# Patient Record
Sex: Female | Born: 1995 | Hispanic: Yes | Smoking: Former smoker
Health system: Southern US, Community
[De-identification: ages and names within clinical notes are randomized; demographics above are authoritative.]

## PROBLEM LIST (undated history)

## (undated) DIAGNOSIS — Z789 Other specified health status: Secondary | ICD-10-CM

## (undated) HISTORY — DX: Other specified health status: Z78.9

## (undated) HISTORY — PX: NO PAST SURGERIES: SHX2092

---

## 2019-01-16 NOTE — L&D Delivery Note (Addendum)
OB/GYN Faculty Practice Delivery Note  Alison Martin is a 24 y.o. G1P0 s/p induced vaginal at [redacted]w[redacted]d. She was admitted for NR NST.   GBS Status: Negative/-- (09/30 1428) Maximum Maternal Temperature: Temp (24hrs), Avg:99 F (37.2 C), Min:98.1 F (36.7 C), Max:101.3 F (38.5 C)  Labor Progress: Admitted for induction of labor Status post foley balloon & cytotec, then pitocin.  AROM 10h 50m prior to delivery with clear fluid  Pitocin until complete dilation achieved.   Delivery Date/Time: 11/12/2019 at 1747 Delivery: Called to room and patient was complete and pushing. Head delivered LOA.  No nuchal cord present. . Shoulder and body delivered in usual fashion. Infant with spontaneous cry, placed skin to skin on mother's abdomen, dried and stimulated. Cord clamped x 2 after 1-minute delay, and cut by FOB. Cord blood drawn. Placenta delivered spontaneously with gentle cord traction. Fundus firm with massage and Pitocin. Labia, perineum, vagina, and cervix inspected with 2nd degree perineal required repair with 3.0, vicryl. .   Placenta: spontaneous, intact, sent to L&D Complications: maternal fever immediately prior to delivery EBL: 250 mL  Analgesia: Epidural anesthesia  Postpartum Planning Mom and baby to mother/baby.  Lactation consult Contraception IP nexplanon     Language Barrier In person interpretor present in room during delivery  Infant: Viable girl   APGAR: 7/9   see delivery summary for infant birthweight  Genia Hotter, M.D.  11/12/2019 6:18 PM  I was present and gloved for delivery of infant and placenta. I assisted resident with laceration as noted above and agree with resident's note.  Sheila Oats, MD OB Fellow, Faculty Practice 11/12/2019 6:48 PM

## 2019-05-04 ENCOUNTER — Encounter: Payer: Self-pay | Admitting: General Practice

## 2019-05-12 ENCOUNTER — Telehealth: Payer: Self-pay | Admitting: General Practice

## 2019-05-12 ENCOUNTER — Other Ambulatory Visit: Payer: Self-pay

## 2019-05-12 ENCOUNTER — Ambulatory Visit (INDEPENDENT_AMBULATORY_CARE_PROVIDER_SITE_OTHER): Payer: Self-pay | Admitting: *Deleted

## 2019-05-12 VITALS — BP 120/82 | HR 85 | Temp 98.0°F | Ht 68.0 in | Wt 197.4 lb

## 2019-05-12 DIAGNOSIS — Z3400 Encounter for supervision of normal first pregnancy, unspecified trimester: Secondary | ICD-10-CM | POA: Insufficient documentation

## 2019-05-12 MED ORDER — CITRANATAL BLOOM 90-1 MG PO TABS: 1.0000 | ORAL_TABLET | Freq: Every day | ORAL | 0 refills | Status: DC

## 2019-05-12 NOTE — Progress Notes (Signed)
   PRENATAL INTAKE SUMMARY  Ms. Mcneece presents today New OB Nurse Interview.  OB History    Gravida  1   Para      Term      Preterm      AB      Living        SAB      TAB      Ectopic      Multiple      Live Births             I have reviewed the patient's medical, obstetrical, social, and family histories, medications, and available lab results. Spanish [place]pre[place] ID: [id number]  SUBJECTIVE She has no unusual complaints  OBJECTIVE Initial nurse interview for history/labs (New OB).  [facility]   Late to care  EDD: 10/19/19 by LMP GA: [redacted]w[redacted]d G1P0 FHT: 145  GENERAL APPEARANCE: alert, well appearing, in no apparent distress, oriented to person, place and time   ASSESSMENT Normal pregnancy  PLAN Prenatal care-[facility] Pnl/HIV  OB Urine Culture GC/CT at next visit with Raelyn Mora, CNM 06/04/19 HgbEval/SMA/CF ([facility]) Panorama A1C AFP Patient to sign up for Babyscripts BP cuff given/ patient to purchase weight scale Ultrasound ordered-anatomy-[place] HD PNV samples given  Clovis Pu, RN

## 2019-05-12 NOTE — Patient Instructions (Signed)
Segundo trimestre de [employer] Trimester of Pregnancy  El segundo trimestre va desde la semana14 hasta la 27 (desde el mes 4 hasta el 6). Este suele ser el momento en el que mejor se siente. En general, las nuseas matutinas han disminuido o han desaparecido completamente. Tendr ms energa y podr aumentarle el apetito. El beb en gestacin se desarrolla rpidamente. Hacia el final del sexto mes, el beb mide aproximadamente 9 pulgadas (23 cm) y pesa alrededor de 1 libras (700 g). Es probable que sienta al beb [employer] 18 y 20 semanas del [place]. Siga estas indicaciones en su casa: Medicamentos  Bax[employer] International de venta libre y los recetados solamente como se lo haya indicado el mdico. Algunos medicamentos son seguros para tomar durante el Psychiatrist y otros no lo son.  Tome vitaminas prenatales que contengan por lo menos (?g) de cido flico.  Si tiene dificultad para mover el intestino (estreimiento), tome un medicamento para ablandar las heces (laxante) si su mdico se lo autoriza. Comida y bebida   Ingiera alimentos saludables de Roundup regular.  No coma carne cruda ni quesos sin cocinar.  Si obtiene poca cantidad de calcio de los alimentos que ingiere, consulte a su mdico sobre la posibilidad de tomar un suplemento diario de calcio.  Evite el consumo de alimentos ricos en grasas y azcares, como los alimentos fritos y los dulces.  Si tiene Programme researcher, broadcasting/film/video (nuseas) o devuelve (vomita): ? Ingiera 4 o 5comidas pequeas por Geophysical data processor de 3abundantes. ? Intente comer algunas galletitas saladas. ? Beba lquidos Altria Group, en lugar de [employer].  Para evitar el estreimiento: ? Consuma alimentos ricos en fibra, como frutas y verduras frescas, cereales integrales y frijoles. ? Beba suficiente lquido para mantener el pis (orina) claro o de color amarillo plido. Actividad  Haga ejercicios solamente como se lo  haya indicado el mdico. Interrumpa la actividad fsica si comienza a tener calambres.  No haga ejercicio si hace demasiado calor, hay demasiada humedad o se encuentra en un lugar de mucha altura (altitud alta).  Evite levantar pesos [employer].  Use zapatos con tacones bajos. Mantenga una buena postura al sentarse y pararse.  Puede continuar teniendo [employer], a menos que el mdico le indique lo contrario. Alivio del dolor y del Dentist  Use un sostn que le brinde buen soporte si sus mamas estn sensibles.  Dese baos de asiento con agua tibia para Engineer, materials o las molestias causadas por las hemorroides. Use una crema para las hemorroides si el mdico la autoriza.  Descanse con las piernas elevadas si tiene calambres o dolor de cintura.  Si desarrolla venas hinchadas y abultadas (vrices) en las piernas: ? Use medias de compresin o medias de descanso como se lo haya indicado el mdico. ? Levante (eleve) los pies durante , 3 o 4veces por Futures trader. ? Limite el consumo de sal en sus alimentos. Cuidado prenatal  Tonga sus preguntas. Llvelas cuando concurra a las visitas prenatales.  Concurra a todas las visitas prenatales como se lo haya indicado el mdico. Esto es importante. Seguridad  [employer] cinturn de seguridad cuando conduzca.  Haga una lista de los nmeros de telfono de Associate Professor, que [employer] nmeros de telfono de familiares, amigos, [place] [facility], as como los departamentos de polica y bomberos. Instrucciones generales  Consulte a su mdico sobre los [employer]oco[employer]illips debe comer o pdale que la ayude a Clinical research associate a quien pueda aconsejarla si necesita ese  servicio.  Consulte a su mdico acerca de dnde se dictan clases prenatales cerca de donde vive. Comience las clases antes del mes 6 de embarazo.  No se d baos de inmersin en agua caliente, baos turcos ni saunas.  No se haga duchas vaginales ni use tampones o toallas higinicas  perfumadas.  No mantenga las piernas cruzadas durante [place].  Vaya al dentista si an no lo hizo. Use un cepillo de cerdas suaves para cepillarse los dientes. Psese el hilo dental suavemente.  No fume, no consuma hierbas ni beba alcohol. No tome frmacos que el mdico no haya autorizado.  No consuma ningn producto que contenga nicotina o tabaco, como cigarrillos y Administrator, Civil Service. Si necesita ayuda para dejar de fumar, consulte al mdico.  Evite el contacto con las bandejas sanitarias de los gatos y la tierra que estos animales usan. Estos elementos contienen bacterias que pueden causar defectos congnitos al beb y la posible prdida del beb (aborto espontneo) o la muerte fetal. Comunquese con un mdico si:  Tiene clicos leves o siente presin en la parte baja del vientre.  Tiene dolor al hacer pis (orinar).  Advierte un lquido con olor ftido que proviene de la vagina.  Tiene Programme researcher, broadcasting/film/video (nuseas), devuelve (vomita) o tiene deposiciones acuosas (diarrea).  Sufre un dolor persistente en el abdomen.  Siente mareos. Solicite ayuda de inmediato si:  Tiene fiebre.  Tiene una prdida de lquido por la vagina.  Tiene sangrado o pequeas prdidas vaginales.  Siente dolor intenso o clicos en el abdomen.  Sube o baja de peso rpidamente.  Tiene dificultades para recuperar el aliento y siente dolor en el pecho.  Sbitamente se le hinchan mucho el rostro, las Banks, los tobillos, los pies o las piernas.  No ha sentido los movimientos del beb durante Georgianne Fick.  Siente un dolor de cabeza intenso que no se alivia al tomar [employer].  Tiene dificultad para ver. Resumen  El segundo trimestre va desde la semana14 hasta la 27, desde el mes 4 hasta el 6. Este suele ser el momento en el que mejor se siente.  Para cuidarse y cuidar a su beb en gestacin, debe comer alimentos saludables, tomar medicamentos solamente si su mdico le indica que lo haga y  hacer actividades que sean seguras para usted y su beb.  Llame al mdico si se enferma o si nota algo inusual acerca de su embarazo. Tambin llame al mdico si necesita ayuda para saber qu alimentos debe comer o si quiere saber qu actividades puede realizar de forma segura. Esta informacin no tiene Theme park manager el consejo del mdico. Asegrese de hacerle al mdico cualquier pregunta que tenga. Document Revised: 09/26/2016 Document Reviewed: 09/26/2016 Elsevier Patient Education  2020 [employer].  Signos de advertencia durante el embarazo Warning Signs During Pregnancy Generalmente, el embarazo dura unas 40 semanas, a partir del Social worker del ltimo perodo hasta que el beb nace. Se divide en tres fases llamadas trimestres.  El Engineer, maintenance trimestre se refiere a Lawyer 1 [employer] la semana 13 de [place].  El segundo trimestre es el comienzo de la semana 14 hasta el final de la semana 27.  El tercer trimestre es el comienzo de la semana 28 hasta el nacimiento del beb. Durante cada trimestre de embarazo, ciertos signos y sntomas pueden indicar un problema. Hable con su mdico acerca de su actual estado de salud y cualquier afeccin mdica que tenga. Asegrese de [employer]user[employer]ch a los que debera estar atenta  e informar. De qu modo la afecta a usted?  Signos de Airline pilotadvertencia en el primer trimestre de embarazo Si bien algunos cambios durante el primer trimestre pueden ser incmodos, la mayora no representa un problema grave. Informe al mdico si tiene alguno de los siguientes signos de advertencia durante Financial risk analystel primer trimestre:  No puede comer ni beber sin vomitar, y esto se prolonga durante ms de Civil engineer, contractingun da.  Tiene sangrado o manchado vaginal junto con clicos parecidos a los de Tax adviserla menstruacin.  Tiene diarrea durante ms de Civil engineer, contractingun da.  Tiene fiebre u otros signos de infeccin, como: ? Engineer, miningDolor o ardor al Geographical information systems officerorinar. ? Olor ftido o secrecin vaginal espesa o amarillenta. Signos de  advertencia en el segundo trimestre de embarazo A medida que el beb crece y cambia durante el segundo trimestre, hay otros signos y sntomas que pueden indicar un problema. Estos incluyen los siguientes:  Signos y sntomas de infeccin, incluyendo Pine Island Centerfiebre.  Signos o sntomas de un aborto espontneo o un parto prematuro, por ejemplo, contracciones regulares, clicos parecidos a los de la menstruacin o dolor en la parte inferior del abdomen.  Secrecin vaginal acuosa o con sangre o sangrado vaginal obvio.  Sentir que el corazn late Dearingfuerte.  Dificultad para respirar.  Nuseas, vmitos o diarrea que dura ms de Civil engineer, contractingun da.  Deseo compulsivo de comer cosas que no son alimentos, tales como arcilla, tiza o [place]. Esto puede ser un signo de una afeccin mdica muy tratable llamada pica. Ms adelante, en el segundo trimestre, observe si tiene signos y sntomas de una enfermedad grave denominada preeclampsia.Estos incluyen los siguientes:  Cambios en la visin.  Un dolor de cabeza intenso que no se [place]oalivia.  Nuseas y vmitos. Tambin es importante observar si el beb deja de moverse o se mueve menos que lo normal durante este trimestre. Signos de Radiographer, therapeuticadvertencia en el tercer trimestre de embarazo A medida que se acerca el tercer trimestre del embarazo, el beb crece y su cuerpo se prepara para el nacimiento. En el tercer trimestre, asegrese de informarle a su mdico si:  Tiene signos y sntomas de infeccin, incluyendo fiebre.  Tiene una hemorragia vaginal abundante.  Nota que su beb se mueve menos que lo habitual o no se mueve.  Tiene nuseas, vmitos o diarrea que dura ms de Civil engineer, contractingun da.  Siente un dolor de cabeza intenso que no se Sunolalivia.  Tiene cambios en la visin, como ver manchas o tener visin borrosa o ver doble.  Aumenta la hinchazn en sus manos o rostro. De qu modo lo afecta al beb? Durante todo el Palmhurstembarazo, siempre informe cualquier signo de advertencia de un problema al  mdico. Esto puede ayudar a prevenir las complicaciones que pueden afectar a su beb, por ejemplo:  Aumento del riesgo de nacimiento prematuro.  Infecciones que pueden transmitirse al beb.  Aumento del riesgo de muerte fetal. Comunquese con un mdico si:  Tiene cualquier signo de advertencia de un problema para el actual trimestre de su embarazo.  Cualquiera de lo siguiente se aplica a usted durante cualquier trimestre del embarazo: ? Tiene emociones fuertes, como tristeza o ansiedad, que interfieren con el trabajo o las relaciones personales. ? Se siente insegura en su casa y necesita ayuda para encontrar un lugar seguro para vivir. ? [place] productos de tabaco, alcohol o drogas y French Southern Territoriesnecesita ayuda para dejar de hacerlo. Solicite ayuda de inmediato si: Tiene signos o sntomas de trabajo de parto antes de las 37 semanas de Mershonembarazo. Estos incluyen los siguientes:  Contracciones separadas unas de otras por intervalos de 5 minutos o menos, o que aumentan en frecuencia, intensidad o duracin.  Dolor abdominal sbito y agudo o Educational psychologist.  Chorro repentino o goteo constante de lquido proveniente de la vagina. Resumen  Generalmente, el embarazo dura unas 40 semanas, a partir del Social worker del ltimo perodo hasta que el beb nace. Se divide en tres fases llamadas trimestres. Cada trimestre tiene signos de advertencia a los que debe [address]  Siempre informe cualquier signo de advertencia a su mdico para evitar las complicaciones que pueden afectar tanto a usted como a su beb.  Hable con su mdico acerca de su actual estado de salud y cualquier afeccin mdica que tenga. Asegrese de [employer]user-[employer]ch a los que debera estar atenta e informar. Esta informacin no tiene Theme park manager el consejo del mdico. Asegrese de hacerle al mdico cualquier pregunta que tenga. Document Revised: 01/16/2017 Document Reviewed: 01/16/2017 Elsevier Patient Education  2020 [employer].  Estudios genticos durante Engineer, manufacturing During Pregnancy Los estudios genticos que se realizan durante el embarazo tambin se denominan pruebas genticas prenatales. Este tipo de pruebas puede determinar si el beb est en riesgo de nacer con un trastorno causado por genes o cromosomas anormales (trastorno gentico). Los cromosomas contienen los genes que controlan cmo se desarrollar el beb en el tero. Hay muchos trastornos genticos diferentes. Algunos ejemplos de trastornos genticos que pueden hallarse a travs de estudios genticos son el sndrome de Down y la fibrosis [place]. Los cambios genticos (mutaciones) pueden transmitirse de padres a hijos. Los [employer] genticos se ofrecen a todas las mujeres antes del embarazo o durante su transcurso. Cada mujer puede elegir si desea Ameren Corporation genticos. Por qu se hacen los estudios genticos? Los estudios genticos se Heritage manager para averiguar si el nio est en riesgo de tener un trastorno gentico. International aid/development worker estudios genticos le permite:  Chiropractor los resultados de los estudios y sus opciones con un asesor gentico.  Prepararse para un beb que podra nacer con un trastorno gentico. Aprender acerca del trastorno con anticipacin le ayuda a estar mejor preparada para manejarlo. Sus mdicos tambin pueden estar preparados en caso de que el beb necesite cuidados especiales antes o despus del nacimiento.  Considerar si desea continuar con el embarazo. En algunos casos, los estudios genticos pueden realizarse para obtener informacin United Stationers rasgos que el nio heredar. Tipos de estudios genticos ONEOK tipos bsicos de estudios genticos. Las pruebas de deteccin sistemticas indican si el beb en gestacin (feto) tiene un riesgo ms alto de tener un trastorno gentico. Las pruebas de diagnstico evalan las clulas fetales reales para diagnosticar un trastorno gentico. Engineer, manufacturing de  deteccin sistemticas     Las pruebas de deteccin sistemticas no le hacen dao al beb. Se recomiendan a todas las mujeres embarazadas. Entre los tipos de pruebas de deteccin sistemticas se incluyen:  Prueba de deteccin de portadores. Este estudio implica analizar genes de ambos padres con muestras de su sangre o saliva. El [place] se [place] para determinar si los padres son portadores de una mutacin gentica que puede transmitirse al beb. En la [employer] Machines, ambos padres deben ser portadores de la mutacin para que el beb est en riesgo.  Prueba de deteccin del primer trimestre. Esta prueba [place] un anlisis de sangre con un estudio de diagnstico por imgenes con ondas de sonido del beb (ecografa fetal). Esta prueba permite detectar si existe riesgo  de que el beb tenga sndrome de Down u otras anomalas debido a tener cromosomas extra. Tambin comprueba si existen anomalas en el corazn, el abdomen o el esqueleto.  La prueba de deteccin del segundo trimestre tambin [place] un anlisis de sangre con una ecografa fetal. Se evala el riesgo de que el beb tenga anomalas genticas en la cara, el cerebro, la columna vertebral, el [place] extremidades.  Pruebas de deteccin sistemtica combinadas o secuenciales. Este tipo de estudios [employer]ent Financial de las pruebas de Programme researcher, broadcasting/film/video del Psychologist, educational trimestre y [place]. Este tipo de estudio puede ser ms preciso que las pruebas de deteccin del Psychologist, educational o del segundo trimestre solas.  Pruebas de ADN fetal en clulas libres. Es un anlisis de sangre que detecta clulas liberadas por la placenta que penetran en la sangre de la [place]. Se puede utilizar para determinar si hay un riesgo de que el beb tenga sndrome de Down, otros sndromes con cromosomas extra o trastornos ocasionados por nmeros anormales de Ambulance person. Este anlisis puede realizarse en cualquier momento despus de las 10 semanas de [place].  Pruebas de  diagnstico Las pruebas de diagnstico conllevan leves riesgos de complicaciones, [place], infeccin y prdida del Media planner. Estos anlisis se realizan solo si el beb tiene riesgo de presentar un trastorno gentico. Puede reunirse con un asesor gentico para hablar sobre los riesgos y beneficios antes de que le realicen pruebas de diagnstico. Algunos ejemplos de pruebas de diagnstico son los siguientes:  Muestras de vellosidades corinicas (MVC). Es un procedimiento en el que se extrae y Coralyn Pear de clulas de la placenta. El procedimiento se puede [place] 10 y 73 [place].  Amniocentesis. Es un procedimiento en el que se extrae y Coralyn Pear del lquido amnitico y las clulas del saco que rodea el beb en gestacin. El procedimiento se puede realizar [employer] 15 y 6 [place]. [place] significan los resultados? En el caso de una prueba de deteccin sistemtica:  [employer] son negativos, con frecuencia significa que el nio no tiene un Higher education careers adviser. An existe una pequea probabilidad de que el nio pueda tener un trastorno gentico.  [employer]ital One son positivos, no significa que el nio tendr un trastorno gentico. Puede indicar que el nio tiene un riesgo ms alto de lo normal de tener un trastorno gentico. En ese caso, es posible que desee hablar con un asesor gentico sobre si debera realizarse pruebas genticas de diagnstico. En el caso de una prueba de diagnstico:  Si el resultado es negativo, es poco probable que el nio tenga un trastorno gentico.  Si el resultado de la prueba es positivo para un trastorno gentico, es probable que el nio tenga el trastorno. La prueba puede no indicar cul es la gravedad del trastorno. Converse con el mdico acerca de sus opciones. Preguntas para hacerle al mdico Antes de hablar con el mdico acerca de los estudios genticos, averige si hay antecedentes de trastornos genticos  en su familia. Tambin puede ser de [employer] Group orgenes tnicos de su familia. Luego, hgale al mdico las siguientes preguntas:  Mi beb est en riesgo de tener un trastorno gentico?  Cules son los beneficios de las pruebas de deteccin sistemticas genticas?  Qu estudios son los ms adecuados para m y mi beb?  Cules son los riesgos de cada estudio o prueba?  Si obtengo un resultado positivo en una prueba de deteccin sistemtica, cul es el  prximo paso?  Debo reunirme con un asesor gentico antes de someterme a una prueba de diagnstico?  Mi pareja u otros miembros de mi familia deben [employer]AM[employer] Corporation estudios?  Cul es el costo de los estudios? Mi seguro mdico cubre los estudios? Resumen  Los estudios genticos se hacen durante el embarazo para averiguar si el nio est en riesgo de tener un trastorno gentico.  Los estudios genticos se ofrecen a todas las mujeres antes del embarazo o durante su transcurso. Cada mujer puede elegir si desea Ameren Corporation genticos.  Hay dos tipos bsicos de estudios genticos. Las pruebas de deteccin sistemticas indican si el beb en gestacin (feto) tiene un riesgo ms alto de tener un trastorno gentico. Las pruebas de diagnstico evalan las clulas fetales reales para diagnosticar un trastorno gentico.  Si el resultado de una prueba gentica de diagnstico es positivo, hable con su mdico acerca de sus opciones. Esta informacin no tiene Theme park manager el consejo del mdico. Asegrese de hacerle al mdico cualquier pregunta que tenga. Document Revised: 05/08/2017 Document Reviewed: 05/08/2017 Elsevier Patient Education  2020 [employer]vin[employer].

## 2019-05-12 NOTE — Telephone Encounter (Signed)
Pt is scheduled for an Detailed Anatomy scan 14 + Ultrasound on 05/25/2019 at 11:00am with [facility].

## 2019-05-13 ENCOUNTER — Encounter: Payer: Self-pay | Admitting: General Practice

## 2019-05-13 LAB — OBSTETRIC PANEL, INCLUDING HIV
Antibody Screen: NEGATIVE
Basophils Absolute: 0 10*3/uL (ref 0.0–0.2)
Basos: 0 %
EOS (ABSOLUTE): 0.1 10*3/uL (ref 0.0–0.4)
Eos: 1 %
HIV Screen 4th Generation wRfx: NONREACTIVE
Hematocrit: 40 % (ref 34.0–46.6)
Hemoglobin: 13.3 g/dL (ref 11.1–15.9)
Hepatitis B Surface Ag: NEGATIVE
Immature Grans (Abs): 0.1 10*3/uL (ref 0.0–0.1)
Immature Granulocytes: 1 %
Lymphocytes Absolute: 2.1 10*3/uL (ref 0.7–3.1)
Lymphs: 21 %
MCH: 27 pg (ref 26.6–33.0)
MCHC: 33.3 g/dL (ref 31.5–35.7)
MCV: 81 fL (ref 79–97)
Monocytes Absolute: 0.5 10*3/uL (ref 0.1–0.9)
Monocytes: 5 %
Neutrophils Absolute: 7.6 10*3/uL — ABNORMAL HIGH (ref 1.4–7.0)
Neutrophils: 72 %
Platelets: 310 10*3/uL (ref 150–450)
RBC: 4.93 x10E6/uL (ref 3.77–5.28)
RDW: 14.2 % (ref 11.7–15.4)
RPR Ser Ql: NONREACTIVE
Rh Factor: POSITIVE
Rubella Antibodies, IGG: 2.03 index (ref >0.99)
WBC: 10.4 10*3/uL (ref 3.4–10.8)

## 2019-05-13 LAB — HEPATITIS C ANTIBODY: Hep C Virus Ab: <0.1 s/co ratio (ref 0.0–0.9)

## 2019-05-13 LAB — HEMOGLOBIN A1C
Est. average glucose Bld gHb Est-mCnc: 114 mg/dL
Hgb A1c MFr Bld: 5.6 % (ref 4.8–5.6)

## 2019-05-14 LAB — AFP TETRA
DIA Mom Value: 2.28
DIA Value (EIA): 300.99 pg/mL
DSR (By Age)    1 IN: 1063
DSR (Second Trimester) 1 IN: 10
Gestational Age: 17 WEEKS
MSAFP Mom: 0.41
MSAFP: 13.6 ng/mL
MSHCG Mom: 2.6
MSHCG: 71572 m[IU]/mL
Maternal Age At EDD: 24.2 yr
Osb Risk: 10000
T18 (By Age): 1:4143 {titer}
Test Results:: POSITIVE — AB
Weight: 197 [lb_av]
uE3 Mom: 0.61
uE3 Value: 0.66 ng/mL

## 2019-05-14 LAB — URINE CULTURE, OB REFLEX

## 2019-05-14 LAB — CULTURE, OB URINE

## 2019-05-20 ENCOUNTER — Encounter: Payer: Self-pay | Admitting: General Practice

## 2019-05-26 ENCOUNTER — Encounter: Payer: Self-pay | Admitting: General Practice

## 2019-06-04 ENCOUNTER — Other Ambulatory Visit: Payer: Self-pay

## 2019-06-04 ENCOUNTER — Ambulatory Visit (INDEPENDENT_AMBULATORY_CARE_PROVIDER_SITE_OTHER): Payer: Self-pay | Admitting: Obstetrics and Gynecology

## 2019-06-04 ENCOUNTER — Other Ambulatory Visit (HOSPITAL_COMMUNITY)
Admission: RE | Admit: 2019-06-04 | Discharge: 2019-06-04 | Disposition: A | Discharge status: Home / Self Care | Payer: Self-pay | Source: Ambulatory Visit | Attending: Obstetrics and Gynecology | Admitting: Obstetrics and Gynecology

## 2019-06-04 ENCOUNTER — Encounter: Payer: Self-pay | Admitting: Obstetrics and Gynecology

## 2019-06-04 VITALS — BP 111/75 | HR 92 | Temp 98.9°F | Wt 198.8 lb

## 2019-06-04 DIAGNOSIS — N898 Other specified noninflammatory disorders of vagina: Secondary | ICD-10-CM

## 2019-06-04 DIAGNOSIS — Z789 Other specified health status: Secondary | ICD-10-CM

## 2019-06-04 DIAGNOSIS — Z3A17 17 weeks gestation of pregnancy: Secondary | ICD-10-CM

## 2019-06-04 DIAGNOSIS — O26892 Other specified pregnancy related conditions, second trimester: Secondary | ICD-10-CM

## 2019-06-04 DIAGNOSIS — Z603 Acculturation difficulty: Secondary | ICD-10-CM

## 2019-06-04 DIAGNOSIS — Z3400 Encounter for supervision of normal first pregnancy, unspecified trimester: Secondary | ICD-10-CM | POA: Insufficient documentation

## 2019-06-04 DIAGNOSIS — O26899 Other specified pregnancy related conditions, unspecified trimester: Secondary | ICD-10-CM | POA: Insufficient documentation

## 2019-06-04 NOTE — Patient Instructions (Signed)
Segundo trimestre de embarazo Second Trimester of Pregnancy El segundo trimestre va desde la semana14 hasta la 27, desde el cuarto hasta el sexto mes, y suele ser el momento en el que mejor se siente. Su organismo se ha adaptado a estar embarazada, y comienza a sentirse fsicamente mejor. En general, las nuseas matutinas han disminuido o han desaparecido completamente, puede tener ms energa y un aumento de apetito. El segundo trimestre es tambin la poca en la que el feto se desarrolla rpidamente. Hacia el final del sexto mes, el feto mide aproximadamente 9pulgadas (23cm) y pesa alrededor de 1 libras (700g). Es probable que sienta que el beb se mueve (da pataditas) entre las 16 y 20semanas del embarazo. Cambios en el cuerpo durante el segundo trimestre Su cuerpo continua experimentando numerosos cambios durante su segundo trimestre. Estos cambios varan de una mujer a otra.  Seguir aumentando de peso. Notar que la parte baja del abdomen sobresale.  Podrn aparecer las primeras estras en las caderas, el abdomen y las mamas.  Es posible que tenga dolores de cabeza que pueden aliviarse con ciertos medicamentos. Los medicamentos que tome deben estar aprobados por el mdico.  Tal vez tenga necesidad de orinar con ms frecuencia porque el feto est ejerciendo presin sobre la vejiga.  Debido al embarazo podr sentir acidez estomacal con frecuencia.  Puede estar estreida, ya que ciertas hormonas enlentecen los movimientos de los msculos que empujan los desechos a travs de los intestinos.  Pueden aparecer hemorroides o abultarse e hincharse las venas (venas varicosas).  Puede sentir dolor en la espalda. Esto se debe a: ? Aumento de peso. ? Las hormonas del embarazo relajan las articulaciones en la pelvis. ? Un cambio en el peso y los msculos que ayudan a mantener su equilibrio.  Sus pechos seguirn creciendo y se pondrn cada vez ms sensibles.  Las encas pueden sangrar y estar  sensibles al cepillado y al hilo dental.  Pueden aparecer zonas oscuras o manchas (cloasma, mscara del embarazo) en el rostro. Esto probablemente se atenuar despus del nacimiento del beb.  Es posible que se forme una lnea oscura desde el ombligo hasta la zona del pubis (linea nigra). Esto probablemente se atenuar despus del nacimiento del beb.  Tal vez haya cambios en el cabello. Esto cambios pueden incluir su engrosamiento, crecimiento rpido y cambios en la textura. Adems, a algunas mujeres se les cae el cabello durante o despus del embarazo, o tienen el cabello seco o fino. Lo ms probable es que el cabello se le normalice despus del nacimiento del beb. Qu debe esperar en las visitas prenatales Durante una visita prenatal de rutina:  La pesarn para asegurarse de que usted y el feto estn creciendo normalmente.  Le tomarn la presin arterial.  Le medirn el abdomen para controlar el desarrollo del beb.  Se escucharn los latidos cardacos fetales.  Se evaluarn los resultados de los estudios solicitados en visitas anteriores. El mdico puede preguntarle lo siguiente:  Cmo se siente.  Si siente los movimientos del beb.  Si ha tenido sntomas anormales, como prdida de lquido, sangrado, dolores de cabeza intensos o clicos abdominales.  Si est consumiendo algn producto que contenga tabaco, como cigarrillos, tabaco de mascar y cigarrillos electrnicos.  Si tiene alguna pregunta. Otros estudios que podrn realizarse durante el segundo trimestre incluyen lo siguiente:  Anlisis de sangre para detectar lo siguiente: ? Concentraciones de hierro bajas (anemia). ? Nivel alto de azcar en la sangre que afecta a las mujeres embarazadas (diabetes   gestacional) entre las semanas 24 y 28. ? Anticuerpos Rh. Esto es para detectar una protena en los glbulos rojos (factor Rh).  Anlisis de orina para detectar infecciones, diabetes o protenas en la orina.  Una ecografa  para confirmar que el beb crece y se desarrolla correctamente.  Una amniocentesis para diagnosticar posibles problemas genticos.  Estudios del feto para descartar espina bfida y sndrome de Down.  Prueba del VIH (virus de inmunodeficiencia humana). Los exmenes prenatales de rutina incluyen la prueba de deteccin del VIH, a menos que decida no realizrsela. Siga estas indicaciones en su casa: Medicamentos  Siga las indicaciones del mdico en relacin con el uso de medicamentos. Durante el embarazo, hay medicamentos que pueden tomarse y otros que no.  Tome vitaminas prenatales que contengan por lo menos 600microgramos (?g) de cido flico.  Si est estreida, tome un laxante suave, si el mdico lo autoriza. Qu debe comer y beber   Lleve una dieta equilibrada que incluya gran cantidad de frutas y verduras frescas, cereales integrales, buenas fuentes de protenas como carnes magras, huevos o tofu, y lcteos descremados. El mdico la ayudar a determinar la cantidad de peso que puede aumentar.  No coma carne cruda ni quesos sin cocinar. Estos elementos contienen grmenes que pueden causar defectos congnitos en el beb.  Si no consume muchos alimentos con calcio, hable con su mdico sobre si debera tomar un suplemento diario de calcio.  Limite el consumo de alimentos con alto contenido de grasas y azcares procesados, como alimentos fritos o dulces.  Para evitar el estreimiento: ? Bebe suficiente lquido para mantener la orina clara o de color amarillo plido. ? Consuma alimentos ricos en fibra, como frutas y verduras frescas, cereales integrales y frijoles. Actividad  Haga ejercicio solamente como se lo haya indicado el mdico. La mayora de las mujeres pueden continuar su rutina de ejercicios durante el embarazo. Intente realizar como mnimo 30minutos de actividad fsica por lo menos 5das a la semana. Deje de hacer ejercicio si experimenta contracciones uterinas.  No levante  objetos pesados, use zapatos de tacones bajos y mantenga una buena postura.  Puede seguir manteniendo relaciones sexuales, a menos que el mdico le indique lo contrario. Alivio del dolor y del malestar  Use un sostn que le brinde buen soporte para prevenir las molestias causadas por la sensibilidad en los pechos.  Dese baos de asiento con agua tibia para aliviar el dolor o las molestias causadas por las hemorroides. Use una crema para las hemorroides si el mdico la autoriza.  Descanse con las piernas elevadas si tiene calambres o dolor de cintura.  Si tiene venas varicosas, use medias de descanso. Eleve los pies durante 15minutos, 3 o 4veces por da. Limite el consumo de sal en su dieta. Cuidados prenatales  Escriba sus preguntas. Llvelas cuando concurra a las visitas prenatales.  Concurra a todas las visitas prenatales tal como se lo haya indicado el mdico. Esto es importante. Seguridad  Use el cinturn de seguridad en todo momento mientras conduce.  Haga una lista de los nmeros de telfono de emergencia, que incluya los nmeros de telfono de familiares, amigos, el hospital y los departamentos de polica y bomberos. Instrucciones generales  Pdale al mdico que la derive a clases de educacin prenatal en su localidad. Debe comenzar a tomar las clases antes de que empiece el mes6 de embarazo.  Pida ayuda si tiene necesidades nutricionales o de asesoramiento durante el embarazo. El mdico puede aconsejarla o derivarla a especialistas para que la   ayuden con diferentes necesidades.  No se d baos de inmersin en agua caliente, baos turcos ni saunas.  No se haga duchas vaginales ni use tampones o toallas higinicas perfumadas.  No mantenga las piernas cruzadas durante mucho tiempo.  Evite el contacto con las bandejas sanitarias de los gatos y la tierra que estos animales usan. Estos elementos contienen bacterias que pueden causar defectos congnitos al beb y la posible  prdida del feto debido a un aborto espontneo o muerte fetal.  Evite fumar, consumir hierbas, beber alcohol y tomar frmacos que no le hayan recetado. Las sustancias qumicas que estos productos contienen pueden afectar la formacin y el desarrollo del beb.  No consuma ningn producto que contenga nicotina o tabaco, como cigarrillos y cigarrillos electrnicos. Si necesita ayuda para dejar de fumar, consulte al mdico.  Visite a su dentista si an no lo ha hecho durante el embarazo. Use un cepillo de dientes blando para higienizarse los dientes y psese el hilo dental con suavidad. Comunquese con un mdico si:  Tiene mareos.  Siente clicos leves, presin en la pelvis o dolor persistente en el abdomen.  Tiene nuseas, vmitos o diarrea persistentes.  Observa una secrecin vaginal con mal olor.  Siente dolor al orinar. Solicite ayuda de inmediato si:  Tiene fiebre.  Tiene una prdida de lquido por la vagina.  Tiene sangrado o pequeas prdidas vaginales.  Siente dolor intenso o clicos en el abdomen.  Sube de peso o baja de peso rpidamente.  Tiene dificultad para respirar y siente dolor de pecho.  Sbitamente se le hinchan mucho el rostro, las manos, los tobillos, los pies o las piernas.  No ha sentido los movimientos del beb durante una hora.  Siente un dolor de cabeza intenso que no se alivia al tomar medicamentos.  Nota cambios en la visin. Resumen  El segundo trimestre va desde la semana14 hasta la 27, desde el cuarto hasta el sexto mes. Es tambin una poca en la que el feto se desarrolla rpidamente.  Su organismo atraviesa por muchos cambios durante el embarazo. Estos cambios varan de una mujer a otra.  Evite fumar, consumir hierbas, beber alcohol y tomar frmacos que no le hayan recetado. Estas sustancias qumicas afectan la formacin y el desarrollo de su beb.  No consuma ningn producto que contenga tabaco, lo que incluye cigarrillos, tabaco de mascar  y cigarrillos electrnicos. Si necesita ayuda para dejar de fumar, consulte al mdico.  Comunquese con su mdico si tiene preguntas sobre esto. Concurra a todas las visitas prenatales tal como se lo haya indicado el mdico. Esto es importante. Esta informacin no tiene como fin reemplazar el consejo del mdico. Asegrese de hacerle al mdico cualquier pregunta que tenga. Document Revised: 05/14/2016 Document Reviewed: 05/14/2016 Elsevier Patient Education  2020 Elsevier Inc.  

## 2019-06-04 NOTE — Progress Notes (Signed)
INITIAL OBSTETRICAL VISIT Patient name: Alison Martin MRN [id number]  Date of birth: 1995-09-22 Chief Complaint:   Initial Prenatal Visit  History of Present Illness:   Alison Martin is a 24 y.o. G1P0 Hispanic female at [redacted]w[redacted]d by LMP with an Estimated Date of Delivery: 11/11/19 being seen today for her initial obstetrical visit.  Her obstetrical history is significant for none. This is a planned pregnancy. She and the father of the baby (FOB)" live together. She has a support system that consists of FOB/her mother/father/friends. Today she reports nausea that is relieved with eating green apples.   Patient's last menstrual period was 01/12/2019. Last pap unknown. Results were: unknown Review of Systems:   Pertinent items are noted in HPI Denies cramping/contractions, leakage of fluid, vaginal bleeding, abnormal vaginal discharge w/ itching/odor/irritation, headaches, visual changes, shortness of breath, chest pain, abdominal pain, severe nausea/vomiting, or problems with urination or bowel movements unless otherwise stated above.  Pertinent History Reviewed:  Reviewed past medical,surgical, social, obstetrical and family history.  Reviewed problem list, medications and allergies. OB History  Gravida Para Term Preterm AB Living  1            SAB TAB Ectopic Multiple Live Births               # Outcome Date GA Lbr Len/2nd Weight Sex Delivery Anes PTL Lv  1 Current            Physical Assessment:   Vitals:   06/04/19 0929  BP: 111/75  Pulse: 92  Temp: 98.9 F (37.2 C)  Weight: 198 lb 12.8 oz (90.2 kg)  Body mass index is 30.23 kg/m.       Physical Examination:  General appearance - well appearing, and in no distress  Mental status - alert, oriented to person, place, and time  Psych:  She has a normal mood and affect  Skin - warm and dry, normal color, no suspicious lesions noted  Chest - effort normal, all lung fields clear to auscultation bilaterally  Heart - normal rate  and regular rhythm  Abdomen - soft, nontender  Extremities:  No swelling or varicosities noted  Pelvic - VULVA: normal appearing vulva with no masses, tenderness or lesions  VAGINA: normal appearing vagina with normal color and discharge, no lesions.   CERVIX: normal appearing cervix without discharge or lesions, no CMT  Thin prep pap is not done d/t insurance coverage   No results found for this or any previous visit (from the past 24 hour(s)).  Assessment & Plan:  1) Low-Risk Pregnancy G1P0 at [redacted]w[redacted]d with an Estimated Date of Delivery: 11/11/19   2) Initial OB visit - Welcomed to practice and introduced self to patient in addition to discussing other advanced practice providers that she may be seeing at this practice - Congratulated patient - Anticipatory guidance on upcoming appointments - Educated on COVID19 and pregnancy and the integration of virtual appointments  - Educated on babyscripts app- patient reports she has not received email, encouraged to look in spam folder and to call office if she still has not received email - patient verbalizes understanding   3) Supervision of normal first pregnancy, antepartum  - US OB Comp + 14 Wk  4) Vaginal discharge during pregnancy, antepartum  - Cervicovaginal ancillary only( [place])  5) Language barrier affecting health care - [employer] video interpreter [place] [phone] used throughout entire visit  Meds: No orders of the defined types were placed in this encounter.  Initial labs obtained Continue prenatal vitamins Reviewed n/v relief measures and warning s/s to report Reviewed recommended weight gain based on pre-gravid BMI Encouraged well-balanced diet Genetic Screening discussed: ordered Cystic fibrosis, SMA, Fragile X screening discussed ordered The nature of [facility] with multiple MDs and other Advanced Practice Providers was explained to patient; also emphasized that  residents, students are part of our team.  Discussed optimized OB schedule and video visits. Advised can have an in-office visit whenever she feels she needs to be seen.  Does not have own BP cuff. Explained to patient that BP will be mailed to her house. Check BP weekly, let us know if >140/90. Advised to call during normal business hours and there is an after-hours nurse line available.    Follow-up: Return in about 4 weeks (around 07/02/2019) for Return OB visit.   Orders Placed This Encounter  Procedures  . US OB Comp + 14 Wk    Brock Mokry MSN, [place] 06/04/2019

## 2019-06-05 ENCOUNTER — Other Ambulatory Visit (INDEPENDENT_AMBULATORY_CARE_PROVIDER_SITE_OTHER): Payer: Self-pay | Admitting: Obstetrics and Gynecology

## 2019-06-05 DIAGNOSIS — O98812 Other maternal infectious and parasitic diseases complicating pregnancy, second trimester: Secondary | ICD-10-CM

## 2019-06-05 DIAGNOSIS — B9689 Other specified bacterial agents as the cause of diseases classified elsewhere: Secondary | ICD-10-CM

## 2019-06-05 DIAGNOSIS — N760 Acute vaginitis: Secondary | ICD-10-CM

## 2019-06-05 DIAGNOSIS — A749 Chlamydial infection, unspecified: Secondary | ICD-10-CM

## 2019-06-05 LAB — CERVICOVAGINAL ANCILLARY ONLY
Bacterial Vaginitis (gardnerella): POSITIVE — AB
Candida Glabrata: NEGATIVE
Candida Vaginitis: NEGATIVE
Chlamydia: POSITIVE — AB
Comment: NEGATIVE
Comment: NEGATIVE
Comment: NEGATIVE
Comment: NEGATIVE
Comment: NEGATIVE
Comment: NORMAL
Neisseria Gonorrhea: NEGATIVE
Trichomonas: NEGATIVE

## 2019-06-05 MED ORDER — AZITHROMYCIN 500 MG PO TABS: 1000.0000 mg | ORAL_TABLET | Freq: Once | ORAL | 0 refills | Status: DC

## 2019-06-05 MED ORDER — METRONIDAZOLE 500 MG PO TABS: 500.0000 mg | ORAL_TABLET | Freq: Two times a day (BID) | ORAL | 0 refills | Status: DC

## 2019-06-05 NOTE — Progress Notes (Signed)
TC to patient with in-hospital Spanish interpreter Mattie Marlin - patient notified of (+) CT and BV results. Explained the transmission of CT from sexual partner. Rx for Azithromycin & Flagyl sent to [facility]. All questions answered. Patient verbalized an understanding of the plan of care and agrees.   Raelyn Mora, CNM 06/05/2019 5:07 PM

## 2019-06-08 ENCOUNTER — Telehealth: Payer: Self-pay | Admitting: *Deleted

## 2019-06-08 ENCOUNTER — Telehealth: Payer: Self-pay | Admitting: General Practice

## 2019-06-08 NOTE — Telephone Encounter (Signed)
Left voice message using interpreter Kandis Mannan ID: [id number]; pt to call clinic regarding results.  Clovis Pu, RN

## 2019-06-08 NOTE — Telephone Encounter (Signed)
Left message on VM in regards to [place] appointment scheduled on 06/22/2019 at 12:30pm with [facility].  Interpreter used for this call.  Pt was asked to call office with any questions or concerns.

## 2019-06-08 NOTE — Telephone Encounter (Signed)
-----   Message from Raelyn Mora, [place] sent at 06/05/2019  5:11 PM EDT ----- Patient notified using hospital interpreter of results and need for Rx

## 2019-06-10 ENCOUNTER — Telehealth: Payer: Self-pay | Admitting: Obstetrics and Gynecology

## 2019-06-10 NOTE — Telephone Encounter (Signed)
Erroneous encounter

## 2019-07-01 ENCOUNTER — Encounter: Payer: Self-pay | Admitting: General Practice

## 2019-07-01 ENCOUNTER — Encounter: Payer: Self-pay | Admitting: Obstetrics and Gynecology

## 2019-07-01 ENCOUNTER — Telehealth: Payer: Self-pay | Admitting: General Practice

## 2019-07-01 NOTE — Telephone Encounter (Signed)
Patient called to cancel appointment for today due to transportation issues.  Transportation services information provided to patient.

## 2019-07-08 ENCOUNTER — Other Ambulatory Visit (HOSPITAL_COMMUNITY)
Admission: RE | Admit: 2019-07-08 | Discharge: 2019-07-08 | Disposition: A | Discharge status: Home / Self Care | Payer: Self-pay | Source: Ambulatory Visit | Attending: Advanced Practice Midwife | Admitting: Advanced Practice Midwife

## 2019-07-08 ENCOUNTER — Ambulatory Visit (INDEPENDENT_AMBULATORY_CARE_PROVIDER_SITE_OTHER): Payer: Self-pay | Admitting: Advanced Practice Midwife

## 2019-07-08 ENCOUNTER — Encounter: Payer: Self-pay | Admitting: Advanced Practice Midwife

## 2019-07-08 ENCOUNTER — Other Ambulatory Visit: Payer: Self-pay

## 2019-07-08 VITALS — BP 122/79 | HR 78 | Wt 204.0 lb

## 2019-07-08 DIAGNOSIS — Z3480 Encounter for supervision of other normal pregnancy, unspecified trimester: Secondary | ICD-10-CM | POA: Insufficient documentation

## 2019-07-08 DIAGNOSIS — Z3402 Encounter for supervision of normal first pregnancy, second trimester: Secondary | ICD-10-CM

## 2019-07-08 DIAGNOSIS — Z3482 Encounter for supervision of other normal pregnancy, second trimester: Secondary | ICD-10-CM

## 2019-07-08 DIAGNOSIS — Z3400 Encounter for supervision of normal first pregnancy, unspecified trimester: Secondary | ICD-10-CM

## 2019-07-08 DIAGNOSIS — Z3A22 22 weeks gestation of pregnancy: Secondary | ICD-10-CM

## 2019-07-08 NOTE — Progress Notes (Signed)
   PRENATAL VISIT NOTE  Subjective:  Alison Martin is a 24 y.o. G1P0 at [redacted]w[redacted]d being seen today for ongoing prenatal care.  She is currently monitored for the following issues for this low-risk pregnancy and has Supervision of normal first pregnancy, antepartum on their problem list.  Patient reports no complaints.   .  .  Movement: Present. Denies leaking of fluid.   The following portions of the patient's history were reviewed and updated as appropriate: allergies, current medications, past family history, past medical history, past social history, past surgical history and problem list.   Objective:   Vitals:   07/08/19 1407  BP: 122/79  Pulse: 78  Weight: 204 lb (92.5 kg)    Fetal Status: Fetal Heart Rate (bpm): 154   Movement: Present     General:  Alert, oriented and cooperative. Patient is in no acute distress.  Skin: Skin is warm and dry. No rash noted.   Cardiovascular: Normal heart rate noted  Respiratory: Normal respiratory effort, no problems with respiration noted  Abdomen: Soft, gravid, appropriate for gestational age.  Pain/Pressure: Absent     Pelvic: Cervical exam deferred        Extremities: Normal range of motion.  Edema: None  Mental Status: Normal mood and affect. Normal behavior. Normal judgment and thought content.   Assessment and Plan:  Pregnancy: G1P0 at [redacted]w[redacted]d 1. Supervision of other normal pregnancy, antepartum - Cervicovaginal ancillary only( [place]) - Patient has not had complete anatomy scan, will get one scheduled at the health department ASAP.    Preterm labor symptoms and general obstetric precautions including but not limited to vaginal bleeding, contractions, leaking of fluid and fetal movement were reviewed in detail with the patient. Please refer to After Visit Summary for other counseling recommendations.   Return in about 5 weeks (around 08/12/2019) for return OB visit and 28 week labs and GTT .  No future appointments.  Thressa Sheller DNP, CNM  07/08/19  2:35 PM

## 2019-07-09 LAB — CERVICOVAGINAL ANCILLARY ONLY
Chlamydia: POSITIVE — AB
Comment: NEGATIVE
Comment: NORMAL
Neisseria Gonorrhea: NEGATIVE

## 2019-07-13 ENCOUNTER — Other Ambulatory Visit: Payer: Self-pay | Admitting: *Deleted

## 2019-07-13 ENCOUNTER — Telehealth: Payer: Self-pay | Admitting: *Deleted

## 2019-07-13 DIAGNOSIS — A749 Chlamydial infection, unspecified: Secondary | ICD-10-CM

## 2019-07-13 MED ORDER — AZITHROMYCIN 500 MG PO TABS: 1000.0000 mg | ORAL_TABLET | Freq: Once | ORAL | 1 refills | Status: AC

## 2019-07-13 NOTE — Telephone Encounter (Signed)
-----   Message from Armando Reichert, CNM sent at 07/11/2019  1:13 PM EDT ----- Patient with +chlamydia on test of cure. Please provide an rx for her and one for her partner so that they both get treated at the same time. Discuss with her that it is important that they both get treated so her partner doesn't pass it back to her after she has been treated.

## 2019-07-13 NOTE — Telephone Encounter (Addendum)
Telephone call using [employer]preter: Armando Reichert ID [phone]. Left voice message for patient to return nurse call regarding test results. Azithromycin 1 gm PO x 1 with 1 refill sent to pharmacy. STD report faxed to [facility] Department.  Clovis Pu, RN

## 2019-07-16 ENCOUNTER — Encounter: Payer: Self-pay | Admitting: General Practice

## 2019-07-21 NOTE — Telephone Encounter (Signed)
Left voice message using [employer] ID: [phone]. Patient to return nurse call.  Clovis Pu, RN

## 2019-07-21 NOTE — Telephone Encounter (Signed)
Spoke with [facility] STD nurse regarding patient and treatment. They will try and get in contact with patient when she go for her ultrasound next week.  Clovis Pu, RN

## 2019-07-31 ENCOUNTER — Encounter: Payer: Self-pay | Admitting: General Practice

## 2019-08-04 NOTE — Telephone Encounter (Signed)
Called [facility] pharmacy to check and see if patient picked up Azithromycin. Per [facility] pharmacy representative, medication was returned to stock due to patient did not pick up medication.  Left voice message with STD Consult RN at [facility] that patient was not treated for chlamydia.  Clovis Pu, RN

## 2019-08-13 ENCOUNTER — Ambulatory Visit (INDEPENDENT_AMBULATORY_CARE_PROVIDER_SITE_OTHER): Payer: Self-pay | Admitting: Licensed Clinical Social Worker

## 2019-08-13 ENCOUNTER — Ambulatory Visit (INDEPENDENT_AMBULATORY_CARE_PROVIDER_SITE_OTHER): Payer: Self-pay | Admitting: Obstetrics and Gynecology

## 2019-08-13 ENCOUNTER — Other Ambulatory Visit: Payer: Self-pay

## 2019-08-13 ENCOUNTER — Encounter: Payer: Self-pay | Admitting: Obstetrics and Gynecology

## 2019-08-13 VITALS — BP 110/67 | HR 79 | Temp 98.5°F | Wt 214.8 lb

## 2019-08-13 DIAGNOSIS — Z23 Encounter for immunization: Secondary | ICD-10-CM

## 2019-08-13 DIAGNOSIS — O98813 Other maternal infectious and parasitic diseases complicating pregnancy, third trimester: Secondary | ICD-10-CM

## 2019-08-13 DIAGNOSIS — Z3400 Encounter for supervision of normal first pregnancy, unspecified trimester: Secondary | ICD-10-CM

## 2019-08-13 DIAGNOSIS — Z789 Other specified health status: Secondary | ICD-10-CM

## 2019-08-13 DIAGNOSIS — A749 Chlamydial infection, unspecified: Secondary | ICD-10-CM

## 2019-08-13 DIAGNOSIS — Z658 Other specified problems related to psychosocial circumstances: Secondary | ICD-10-CM

## 2019-08-13 MED ORDER — TETANUS-DIPHTH-ACELL PERTUSSIS 5-2.5-18.5 LF-MCG/0.5 IM SUSP: 0.5000 mL | Freq: Once | INTRAMUSCULAR | 0 refills | Status: AC

## 2019-08-13 NOTE — BH Specialist Note (Signed)
Integrated Behavioral Health Initial Visit  MRN: [id number] Name: Alison Martin  Number of Integrated Behavioral Health Clinician visits:: 1 Session Start time: 11:22am Session End time: 11:35am Total time: 15 mins in person at [facility]   Type of Service: Integrated Behavioral Health- Individual Interpretor:no  Interpretor Name and Language: none   Warm Hand Off Completed.       SUBJECTIVE: Alison Martin is a 24 y.o. female accompanied by n/a Patient was referred by T.Martin RN for high phq9 Patient reports the following symptoms/concerns:  Duration of problem: beginning of pregnancy; Severity of problem: mild   OBJECTIVE: Mood: good and Affect: pleasant  Risk of harm to self or others: No risk of harm to self or others.   LIFE CONTEXT: Family and Social: Lives with boyfriend  School/Work: n/a Self-Care: n/a Life Changes: new pregnancy   GOALS ADDRESSED: Patient will: 1. Reduce symptoms of: psychosocial stress and chlamydia 2. Increase knowledge and/or ability of: diagnosis and implement treatment and education to alleviate psychosocial stress  3. Demonstrate ability to: self manage   INTERVENTIONS: Interventions utilized: supportive counseling   Standardized Assessments completed:    Routine Prenatal from 08/13/2019 in [facility]  PHQ-9 Total Score 5      ASSESSMENT: Patient currently experiencing psychosocial issues    Patient may benefit from wraparound services   PLAN: 1. Follow up with behavioral health clinician on : as needed  2. Behavioral recommendations: Take all prescribed medicine, strongly recommended partner get treatment, contact casework at DSS regarding medicaid application  3. Referral(s): [facility] and [facility]  4. "From scale of 1-10, how likely are you to follow plan?":   Gwyndolyn Saxon, LCSW

## 2019-08-13 NOTE — Progress Notes (Signed)
LOW-RISK PREGNANCY OFFICE VISIT Patient name: Alison Martin MRN [id number]  Date of birth: September 04, 1995 Chief Complaint:   Routine Prenatal Visit  History of Present Illness:   Alison Martin is a 24 y.o. G1P0 female at [redacted]w[redacted]d with an Estimated Date of Delivery: 11/11/19 being seen today for ongoing management of a low-risk pregnancy.  Today she reports pelvic pressure. Contractions: Not present. Vag. Bleeding: None.  Movement: Present. denies leaking of fluid. Review of Systems:   Pertinent items are noted in HPI Denies abnormal vaginal discharge w/ itching/odor/irritation, headaches, visual changes, shortness of breath, chest pain, abdominal pain, severe nausea/vomiting, or problems with urination or bowel movements unless otherwise stated above. Pertinent History Reviewed:  Reviewed past medical,surgical, social, obstetrical and family history.  Reviewed problem list, medications and allergies. Physical Assessment:   Vitals:   08/13/19 0813  BP: 110/67  Pulse: 79  Temp: 98.5 F (36.9 C)  Weight: (!) 214 lb 12.8 oz (97.4 kg)  Body mass index is 32.66 kg/m.        Physical Examination:   General appearance: Well appearing, and in no distress  Mental status: Alert, oriented to person, place, and time  Skin: Warm & dry  Cardiovascular: Normal heart rate noted  Respiratory: Normal respiratory effort, no distress  Abdomen: Soft, gravid, nontender  Pelvic: Cervical exam deferred         Extremities: Edema: None  Fetal Status: Fetal Heart Rate (bpm): 140 Fundal Height: 29 cm Movement: Present    No results found for this or any previous visit (from the past 24 hour(s)).  Assessment & Plan:  1) Low-risk pregnancy G1P0 at [redacted]w[redacted]d with an Estimated Date of Delivery: 11/11/19   2) Supervision of normal first pregnancy, antepartum - Glucose Tolerance, 2 Hours w/1 Hour - HIV Antibody (routine testing w rflx) - RPR - CBC - Tdap (BOOSTRIX) 5-2.5-18.5 LF-MCG/0.5 injection; Inject 0.5  mLs into the muscle once for 1 dose.  Dispense: 0.5 mL; Refill: 0 - Appt with Gwyndolyn Saxon, LCSW later this morning  3) Language barrier affecting health care - [employer] Video Spanish Interpreter Gillis Santa [phone] utilized for entire visit  4) Chlamydia infection affecting pregnancy in third trimester - Patient reports that her partner got treated about 1.5 months ago - Discussed the risks associated with (+) CT in pregnancy ( increased risk of PTL, PTB, lengthy NICU stay, baby struggling to survive being born early, and worse case scenario not surviving being born early) - Discussed plan of a TOC in 2 weeks - Urge patient to really take treatment and avoiding reinfection seriously - Patient verbalized an understanding and agrees.    Meds:  Meds ordered this encounter  Medications  . Tdap (BOOSTRIX) 5-2.5-18.5 LF-MCG/0.5 injection    Sig: Inject 0.5 mLs into the muscle once for 1 dose.    Dispense:  0.5 mL    Refill:  0   Labs/procedures today: 2 hr GTT, 3rd trimester labs  Plan:  Continue routine obstetrical care   Reviewed: Preterm labor symptoms and general obstetric precautions including but not limited to vaginal bleeding, contractions, leaking of fluid and fetal movement were reviewed in detail with the patient.  All questions were answered.   Follow-up: Return in about 2 weeks (around 08/27/2019) for Return OB visit & TOC.  Orders Placed This Encounter  Procedures  . Glucose Tolerance, 2 Hours w/1 Hour  . HIV Antibody (routine testing w rflx)  . RPR  . CBC   Raelyn Mora MSN, CNM  08/13/2019  

## 2019-08-13 NOTE — Patient Instructions (Signed)
Evaluacin de los movimientos fetales Fetal Movement Counts Nombre del paciente: ________________________________________________ Alison Martin estimada: ____________________ [name] evaluacin de los movimientos fetales?  Una evaluacin de los movimientos fetales es el registro del nmero de veces que siente que el beb se mueve durante un cierto perodo de [name]. Esto tambin se puede denominar recuento de patadas fetales. Una evaluacin de movimientos fetales se recomienda a todas las embarazadas. Es posible que le indiquen que comience a Development worker, community los movimientos fetales desde la semana 28 de [place]. Preste atencin cuando sienta que el beb est ms activo. Podr detectar los ciclos en que el beb duerme y est despierto. Tambin podr detectar que ciertas cosas hacen que su beb se mueva ms. Deber realizar una evaluacin de los movimientos fetales en las siguientes situaciones:  Cuando el beb est ms activo habitualmente.  A la [name], todos los [place]. Un buen momento para evaluar los movimientos fetales es cuando est descansando, despus de haber comido y bebido algo. Cmo debo contar los movimientos fetales? 1. Encuentre un lugar tranquilo y cmodo. Sintese o acustese de lado. 2. Anote la fecha, la hora de inicio y de finalizacin y la cantidad de movimientos que sinti entre esas dos horas. Lleve esta informacin a las visitas de control. 3. Anote la hora de inicio cuando Designer, industrial/product. 4. Cuente las pataditas, revoloteos, chasquidos, vueltas o pinchazos. Debe sentir al menos . 5. Puede dejar de contar despus de haber sentido 10 movimientos o de haber contado [employer]. Anote la hora de finalizacin. 6. Si no siente en 2horas, comunquese con su mdico para obtener ms indicaciones. Es posible que el mdico quiera realizar estudios adicionales para Company secretary del beb. Comunquese con un mdico si:  Siente  menos de en 2horas.  El beb no se mueve tanto como suele hacerlo. Fecha: ____________ [name] inicio: ____________ [name] finalizacin: ____________ Movimientos: ____________ [name]: ____________ [name] inicio: ____________ [name] finalizacin: ____________ Movimientos: ____________ [name]: ____________ [name] inicio: ____________ [name] finalizacin: ____________ Movimientos: ____________ [name]: ____________ [name] inicio: ____________ [name] finalizacin: ____________ Movimientos: ____________ [name]: ____________ [name] inicio: ____________ [name] de finalizacin: ____________ Movimientos: ____________ [name]: ____________ [name] inicio: ____________ [name] de finalizacin: ____________ Movimientos: ____________ [name]: ____________ [name] inicio: ____________ [name] de finalizacin: ____________ Movimientos: ____________ [name]: ____________ [name] inicio: ____________ [name] finalizacin: ____________ Movimientos: ____________ [name]: ____________ [name] inicio: ____________ [name] de finalizacin: ____________ Movimientos: ____________ Esta informacin no tiene como fin reemplazar el consejo del mdico. Asegrese de hacerle al mdico cualquier pregunta que tenga. Document Revised: 10/28/2018 Document Reviewed: 10/28/2018 Elsevier Patient Education  2020 [employer]. Dieta con alto contenido de hierro Iron-Rich Diet  El hierro es un mineral que ayuda al organismo a producir hemoglobina. La hemoglobina es una protena de los glbulos rojos que transporta el oxgeno a los tejidos del cuerpo. Consumir muy poco hierro [employer] se sienta dbil y [place], y aumentar su riesgo de contraer infecciones. El hierro es un componente natural de muchos alimentos y muchos otros alimentos tienen hierro agregado (alimentos fortificados con hierro). Es posible que deba seguir una dieta con alto contenido de hierro si no tiene suficiente hierro en el cuerpo debido a Theatre manager.  La cantidad de potasio que necesita diariamente depende de su edad, su sexo y las afecciones que pueda [place]. Siga las indicaciones de su mdico o un especialista en alimentacin y nutricin (nutricionista) sobre la cantidad  de hierro que Therapist, music. Cules son algunos consejos para seguir este plan? Leer las etiquetas de los alimentos  Lea las etiquetas de los alimentos para saber la cantidad de miligramos (mg) de hierro que hay en cada porcin. Al cocinar  Cocine los alimentos en ollas de hierro.  Tome estas medidas para que el cuerpo pueda absorber el hierro de ciertos alimentos con ms facilidad: ? Antes de cocinarlos, remoje los frijoles durante la noche. ? Remoje los cereales integrales durante la noche y culelos antes de usarlos para cocinar. ? Prepare un fermento con las harinas antes del horneado, por ejemplo, usando levadura en la masa del pan. Planificacin de las comidas  Cuando coma alimentos que contengan hierro, debe comerlos con alimentos ricos en vitamina C. Entre ellos, se incluyen las [place]oc[place]ontas, los pimientos, los tomates, las papas y Social worker. La vitamina C ayuda al organismo a Set designer. Informacin general  Tome los suplementos de hierro solamente como se lo haya indicado el mdico. La sobredosis de hierro puede ser potencialmente mortal. Si le recetan suplementos de hierro, tmelos con jugo de naranja o un suplemento de vitaminaC.  Cuando coma alimentos fortificados con hierro o tome un suplemento de hierro, tambin debe consumir alimentos que contengan hierro naturalmente, como carne, aves y pescado. Consumir alimentos ricos en hierro naturalmente ayuda al organismo a absorber el hierro que se aade a otros alimentos o que contiene un suplemento.  Ciertos alimentos y bebidas impiden que el cuerpo absorba el hierro adecuadamente. No consuma estos alimentos en la misma comida que aquellos con alto contenido de hierro o con suplementos de Company secretary.  Estos alimentos incluyen: ? Caf, t negro y vino tinto. ? Leche, productos lcteos y alimentos con alto contenido de calcio. ? Porotos y soja. ? Cereales integrales. Qu tipos de alimentos debo consumir? Frutas Ciruelas pasas. Pasas de uva. Consuma frutas con alto contenido de vitamina C, por ejemplo, naranjas, pomelos y fresas, junto con alimentos ricos en hierro. Verduras [place] (cocida). Guisantes. Brcoli. Verduras fermentadas. Consuma verduras ricas en vitamina C, como las verduras de [place], las papas, los morrones y los tomates, junto con los alimentos ricos en hierro. Cereales Cereales para el desayuno fortificados con hierro. Pan de trigo integral fortificado con hierro. Arroz enriquecido. Granos germinados. Carnes y otras protenas Hgado de res. Ostras. Carne de vaca. Camarones. Pavo. Pollo. Atn. Sardinas. Garbanzos. Frutos secos. Tofu. Semillas de calabaza. Bebidas Jugo de tomate. Jugo de naranja recin exprimido. Jugo de ciruelas. T de hibisco. Batidos instantneos fortificados para el desayuno. Dulces y Statistician. Alios y condimentos Tahini. Salsa de soja fermentada. Otros alimentos Germen de trigo. Es posible que los productos mencionados arriba no formen una lista completa de las bebidas o los alimentos recomendados. Comunquese con un nutricionista para obtener ms informacin. Qu alimentos debo evitar? Cereales Cereales integrales. Cereal de salvado. Harina de salvado. Avena. Carnes y [address]. Productos elaborados a base de protena de la soja. Frijoles negros. Lentejas. Frijoles mungo. Guisantes secos. Lcteos Leche. Crema. Queso. Yogur. Requesn. Bebidas Caf. T negro. Vino tinto. Dulces y [employer]. Chocolate. Helados. Otros alimentos [place]. Organo. Grandes cantidades de perejil. Es posible que los productos que se enumeran ms arriba no sean una lista completa de los alimentos y las bebidas que se Theatre stage manager.  Comunquese con un nutricionista para obtener ms informacin. Resumen  El hierro es un mineral que ayuda al organismo a producir hemoglobina. La hemoglobina es una protena de los glbulos rojos  que transporta el oxgeno a los tejidos del cuerpo.  El hierro es un componente natural de muchos alimentos y muchos otros alimentos tienen hierro agregado (alimentos fortificados con hierro).  Cuando coma alimentos que contengan hierro, debe comerlos con alimentos ricos en vitamina C. La vitamina C ayuda al organismo a Set designer.  Ciertos alimentos y bebidas impiden que el cuerpo absorba el hierro [address], como los cereales integrales y los productos lcteos. Debe evitar consumir estos alimentos en la misma comida que aquellos con alto contenido de hierro o con suplementos de hierro. Esta informacin no tiene Theme park manager el consejo del mdico. Asegrese de hacerle al mdico cualquier pregunta que tenga. Document Revised: 03/12/2017 Document Reviewed: 03/12/2017 Elsevier Patient Education  2020 [employer].

## 2019-08-14 LAB — CBC
Hematocrit: 33.5 % — ABNORMAL LOW (ref 34.0–46.6)
Hemoglobin: 10.9 g/dL — ABNORMAL LOW (ref 11.1–15.9)
MCH: 26 pg — ABNORMAL LOW (ref 26.6–33.0)
MCHC: 32.5 g/dL (ref 31.5–35.7)
MCV: 80 fL (ref 79–97)
Platelets: 323 10*3/uL (ref 150–450)
RBC: 4.2 x10E6/uL (ref 3.77–5.28)
RDW: 14.2 % (ref 11.7–15.4)
WBC: 14 10*3/uL — ABNORMAL HIGH (ref 3.4–10.8)

## 2019-08-14 LAB — GLUCOSE TOLERANCE, 2 HOURS W/ 1HR
Glucose, 1 hour: 177 mg/dL (ref 65–179)
Glucose, 2 hour: 118 mg/dL (ref 65–152)
Glucose, Fasting: 91 mg/dL (ref 65–91)

## 2019-08-14 LAB — RPR: RPR Ser Ql: NONREACTIVE

## 2019-08-14 LAB — HIV ANTIBODY (ROUTINE TESTING W REFLEX): HIV Screen 4th Generation wRfx: NONREACTIVE

## 2019-08-17 ENCOUNTER — Telehealth: Payer: Self-pay | Admitting: *Deleted

## 2019-08-17 DIAGNOSIS — O99019 Anemia complicating pregnancy, unspecified trimester: Secondary | ICD-10-CM

## 2019-08-17 MED ORDER — FERROUS SULFATE 325 (65 FE) MG PO TABS: 325.0000 mg | ORAL_TABLET | Freq: Two times a day (BID) | ORAL | 3 refills | Status: DC

## 2019-08-17 MED ORDER — ASCORBIC ACID 500 MG PO TABS: 500.0000 mg | ORAL_TABLET | Freq: Two times a day (BID) | ORAL | 3 refills | Status: DC

## 2019-08-17 NOTE — Telephone Encounter (Signed)
Left voice message using [employer] Interpreter ID: [phone] for patient to return nurse call regarding test results.  Clovis Pu, RN

## 2019-08-17 NOTE — Telephone Encounter (Signed)
-----   Message from [place], [place] sent at 08/14/2019  6:19 AM EDT ----- Regarding: FW: Please start her on FeSO4 325 mg BID and Vitamin C 500 mg BID  EVERYDAY. Thank you! ----- Message ----- From: Interface, [facility] Lab Results In Sent: 08/14/2019   5:38 AM EDT To: Raelyn Mora, CNM

## 2019-08-26 ENCOUNTER — Encounter: Payer: Self-pay | Admitting: Obstetrics and Gynecology

## 2019-09-09 ENCOUNTER — Ambulatory Visit (INDEPENDENT_AMBULATORY_CARE_PROVIDER_SITE_OTHER): Payer: Self-pay | Admitting: Certified Nurse Midwife

## 2019-09-09 ENCOUNTER — Other Ambulatory Visit (HOSPITAL_COMMUNITY)
Admission: RE | Admit: 2019-09-09 | Discharge: 2019-09-09 | Disposition: A | Discharge status: Home / Self Care | Payer: Self-pay | Source: Ambulatory Visit | Attending: Obstetrics and Gynecology | Admitting: Obstetrics and Gynecology

## 2019-09-09 ENCOUNTER — Other Ambulatory Visit: Payer: Self-pay

## 2019-09-09 VITALS — BP 129/76 | HR 93 | Temp 98.3°F | Wt 218.0 lb

## 2019-09-09 DIAGNOSIS — Z113 Encounter for screening for infections with a predominantly sexual mode of transmission: Secondary | ICD-10-CM

## 2019-09-09 DIAGNOSIS — Z3400 Encounter for supervision of normal first pregnancy, unspecified trimester: Secondary | ICD-10-CM | POA: Insufficient documentation

## 2019-09-09 DIAGNOSIS — Z3A31 31 weeks gestation of pregnancy: Secondary | ICD-10-CM

## 2019-09-09 NOTE — Progress Notes (Signed)
   PRENATAL VISIT NOTE  Subjective:  Alison Martin is a 24 y.o. G1P0 at [redacted]w[redacted]d being seen today for ongoing prenatal care.  She is currently monitored for the following issues for this low-risk pregnancy and has Supervision of normal first pregnancy, antepartum on their problem list.  Patient reports no complaints.  Contractions: Not present. Vag. Bleeding: None.  Movement: Present. Denies leaking of fluid.   The following portions of the patient's history were reviewed and updated as appropriate: allergies, current medications, past family history, past medical history, past social history, past surgical history and problem list.   Objective:   Vitals:   09/09/19 0956  BP: 129/76  Pulse: 93  Temp: 98.3 F (36.8 C)  Weight: 218 lb (98.9 kg)    Fetal Status: Fetal Heart Rate (bpm): 141 Fundal Height: 31 cm Movement: Present     General:  Alert, oriented and cooperative. Patient is in no acute distress.  Skin: Skin is warm and dry. No rash noted.   Cardiovascular: Normal heart rate noted  Respiratory: Normal respiratory effort, no problems with respiration noted  Abdomen: Soft, gravid, appropriate for gestational age.  Pain/Pressure: Present     Pelvic: Cervical exam deferred        Extremities: Normal range of motion.  Edema: None  Mental Status: Normal mood and affect. Normal behavior. Normal judgment and thought content.   Assessment and Plan:  Pregnancy: G1P0 at [redacted]w[redacted]d 1. Screening examination for STD (sexually transmitted disease) - Urine cytology ancillary only([place])  2. Supervision of normal first pregnancy, antepartum  3. [redacted] weeks gestation of pregnancy - Anticipatory guidance given regarding next visits - Encouraged daily stretching to relieve minor aches of late pregnancy and encourage optimal positioning of baby  Preterm labor symptoms and general obstetric precautions including but not limited to vaginal bleeding, contractions, leaking of fluid and fetal  movement were reviewed in detail with the patient. Please refer to After Visit Summary for other counseling recommendations.    Future Appointments  Date Time Provider Department Center  09/24/2019  9:30 AM Raelyn Mora, CNM [facility] None   Edd Arbour, CNM, MSN, [facility] 09/09/19 10:16 AM

## 2019-09-09 NOTE — Patient Instructions (Signed)
Tercer trimestre de embarazo Third Trimester of Pregnancy El tercer trimestre comprende desde la semana28 hasta la semana40 (desde el mes7 hasta el mes9). El tercer trimestre es un perodo en el que el beb en gestacin (feto) crece rpidamente. Hacia el final del noveno mes, el feto mide alrededor de 20pulgadas (45cm) de largo y pesa entre 6 y 10 libras (2,700 y 4,500kg). Cambios en el cuerpo durante el tercer trimestre Su organismo continuar atravesando por muchos cambios durante el embarazo. Estos cambios varan de una mujer a otra. Durante el tercer trimestre:  Seguir aumentando de peso. Es de esperar que aumente entre 25 y 35libras (11 y 16kg) hacia el final del embarazo.  Podrn aparecer las primeras estras en las caderas, el abdomen y las mamas.  Puede tener necesidad de orinar con ms frecuencia porque el feto baja hacia la pelvis y ejerce presin sobre la vejiga.  Puede desarrollar o continuar teniendo acidez estomacal. Esto se debe a que el aumento de las hormonas hace que los msculos en el tubo digestivo trabajen ms lentamente.  Puede desarrollar o continuar teniendo estreimiento debido a que el aumento de las hormonas ralentiza la digestin y hace que los msculos que empujan los desechos a travs de los intestinos se relajen.  Puede desarrollar hemorroides. Estas son venas hinchadas (venas varicosas) en el recto que pueden causar picazn o dolor.  Puede desarrollar venas hinchadas y abultadas (venas varicosas) en las piernas.  Puede presentar ms dolor en la pelvis, la espalda o los muslos. Esto se debe al aumento de peso y al aumento de las hormonas que relajan las articulaciones.  Tal vez haya cambios en el cabello. Esto cambios pueden incluir su engrosamiento, crecimiento rpido y cambios en la textura. Adems, a algunas mujeres se les cae el cabello durante o despus del embarazo, o tienen el cabello seco o fino. Lo ms probable es que el cabello se le normalice  despus del nacimiento del beb.  Sus pechos seguirn creciendo y se pondrn cada vez ms sensibles. Un lquido amarillo (calostro) puede salir de sus pechos. Esta es la primera leche que usted produce para su beb.  El ombligo puede salir hacia afuera.  Puede observar que se le hinchan las manos, el rostro o los tobillos.  Puede presentar un aumento del hormigueo o entumecimiento en las manos, brazos y piernas. La piel de su vientre tambin puede sentirse entumecida.  Puede sentir que le falta el aire debido a que se expande el tero.  Puede tener ms problemas para dormir. Esto puede deberse al tamao de su vientre, una mayor necesidad de orinar y un aumento en el metabolismo de su cuerpo.  Puede notar que el feto "baja" o lo siente ms bajo, en el abdomen (aligeramiento).  Puede tener un aumento de la secrecin vaginal.  Puede notar que las articulaciones se sienten flojas y puede sentir dolor alrededor del hueso plvico. Qu debe esperar en las visitas prenatales Le harn exmenes prenatales cada 2semanas hasta la semana36. A partir de ese momento le harn los exmenes semanales. Durante una visita prenatal de rutina:  La pesarn para asegurarse de que usted y el beb estn creciendo normalmente.  Le tomarn la presin arterial.  Le medirn el abdomen para controlar el desarrollo del beb.  Se escucharn los latidos cardacos fetales.  Se evaluarn los resultados de los estudios solicitados en visitas anteriores.  Le revisarn el cuello del tero cuando est prxima la fecha de parto para controlar si el cuello uterino   se ha afinado o adelgazado (borrado).  Le harn una prueba de estreptococos del grupo B. Esto sucede entre las semanas 35 y 37. El mdico puede preguntarle lo siguiente:  Cmo le gustara que fuera el parto.  Cmo se siente.  Si siente los movimientos del beb.  Si ha tenido sntomas anormales, como prdida de lquido, sangrado, dolores de cabeza  intensos o clicos abdominales.  Si est consumiendo algn producto que contenga tabaco, como cigarrillos, tabaco de mascar y cigarrillos electrnicos.  Si tiene alguna pregunta. Otros exmenes o estudios de deteccin que pueden realizarse durante el tercer trimestre incluyen lo siguiente:  Anlisis de sangre para controlar los niveles de hierro (anemia).  Controles fetales para determinar su salud, nivel de actividad y crecimiento. Si tiene alguna enfermedad o hay problemas durante el embarazo, le harn estudios.  Prueba sin estrs. Esta prueba verifica la salud de su beb y se utiliza para detectar signos de problemas, tales como si el beb no est recibiendo suficiente oxgeno. Durante esta prueba, se coloca un cinturn alrededor de su vientre. Al moverse el beb, se controla su frecuencia cardaca. Qu es el falso trabajo de parto? El falso trabajo de parto es una afeccin en la que se sienten pequeos e irregulares espasmos de los msculos del tero (contracciones) que generalmente desaparecen al hacer reposo, cambiar de posicin o al beber agua. Estas contracciones se llaman contracciones de Braxton Hicks. Las contracciones pueden durar horas, das o incluso semanas, antes de que el verdadero trabajo de parto se inicie. Si las contracciones ocurren a intervalos regulares, se vuelven ms frecuentes, aumentan en intensidad o se vuelven dolorosas, debera ver al mdico.  Cules son los signos del trabajo de parto?  Clicos abdominales.  Contracciones regulares que comienzan en intervalos de 10 minutos y se vuelven ms fuertes y ms frecuentes con el tiempo.  Contracciones que comienzan en la parte superior del tero y se extienden hacia abajo, a la zona inferior del abdomen y la espalda.  Aumento de la presin en la pelvis y dolor latente en la espalda.  Una secrecin de mucosidad acuosa o con sangre que sale de la vagina.  Prdida de lquido amnitico. Esto tambin se conoce como  "ruptura de la bolsa de las aguas". Esto puede ser un chorro o un goteo constante y lento de lquido. Informe a su mdico si tiene un color u olor extrao. Si tiene alguno de estos signos, llame a su mdico de inmediato, incluso si es antes de la fecha de parto. Siga estas indicaciones en su casa: Medicamentos  Siga las indicaciones del mdico en relacin con el uso de medicamentos. Durante el embarazo, hay medicamentos que pueden tomarse y otros que no.  Tome vitaminas prenatales que contengan por lo menos 600microgramos (?g) de cido flico.  Si est estreida, tome un laxante suave, si el mdico lo autoriza. Qu debe comer y beber   Lleve una dieta equilibrada que incluya gran cantidad de frutas y verduras frescas, cereales integrales, buenas fuentes de protenas como carnes magras, huevos o tofu, y lcteos descremados. El mdico la ayudar a determinar la cantidad de peso que puede aumentar.  No coma carne cruda ni quesos sin cocinar. Estos elementos contienen grmenes que pueden causar defectos congnitos en el beb.  Si no consume muchos alimentos con calcio, hable con su mdico sobre si debera tomar un suplemento diario de calcio.  La ingesta diaria de cuatro o cinco comidas pequeas en lugar de tres comidas abundantes.  Limite el   consumo de alimentos con alto contenido de grasas y azcares procesados, como alimentos fritos o dulces.  Para evitar el estreimiento: ? Bebe suficiente lquido para mantener la orina clara o de color amarillo plido. ? Consuma alimentos ricos en fibra, como frutas y verduras frescas, cereales integrales y frijoles. Actividad  Haga ejercicio solamente como se lo haya indicado el mdico. La mayora de las mujeres pueden continuar su rutina de ejercicios durante el embarazo. Intente realizar como mnimo 30minutos de actividad fsica por lo menos 5das a la semana. Deje de hacer ejercicio si experimenta contracciones uterinas.  Evite levantar pesos  excesivos.  No haga ejercicio en condiciones de calor o humedad extremas, o a grandes alturas.  Use zapatos cmodos de tacn bajo.  Adopte una buena postura.  Puede seguir teniendo relaciones sexuales, excepto que el mdico le diga lo contrario. Alivio del dolor y del malestar  Haga pausas frecuentes y descanse con las piernas elevadas si tiene calambres en las piernas o dolor en la zona lumbar.  Dese baos de asiento con agua tibia para aliviar el dolor o las molestias causadas por las hemorroides. Use una crema para las hemorroides si el mdico la autoriza.  Use un sostn que le brinde buen soporte para prevenir las molestias causadas por la sensibilidad en los pechos.  Si tiene venas varicosas: ? Use pantimedias que brinden soporte o medias de compresin como se lo haya indicado el mdico. ? Eleve los pies durante 15minutos, 3 o 4veces por da. Cuidados prenatales  Escriba sus preguntas. Llvelas cuando concurra a las visitas prenatales.  Concurra a todas las visitas prenatales tal como se lo haya indicado el mdico. Esto es importante. Seguridad  Use el cinturn de seguridad en todo momento mientras conduce.  Haga una lista de los nmeros de telfono de emergencia, que incluya los nmeros de telfono de familiares, amigos, el hospital y los departamentos de polica y bomberos. Instrucciones generales  Evite el contacto con las bandejas sanitarias de los gatos y la tierra que estos animales usan. Estos elementos contienen grmenes que pueden causar defectos congnitos en el beb. Si tiene un gato, pdale a alguien que limpie la caja de arena por usted.  No haga viajes largos excepto que sea absolutamente necesario y solo con la autorizacin de su mdico.  No se d baos de inmersin en agua caliente, baos turcos ni saunas.  No beber alcohol.  No consuma ningn producto que contenga nicotina o tabaco, como cigarrillos y cigarrillos electrnicos. Si necesita ayuda para  dejar de fumar, consulte al mdico.  No use hierbas medicinales ni medicamentos que no le hayan recetado. Estas sustancias qumicas afectan la formacin y el desarrollo del beb.  No se haga duchas vaginales ni use tampones o toallas higinicas perfumadas.  No mantenga las piernas cruzadas durante largos periodos de tiempo.  Para prepararse para la llegada de su beb: ? Tome clases prenatales para entender, practicar, y hacer preguntas sobre el trabajo de parto y el parto. ? Haga un ensayo de la partida al hospital. ? Visite el hospital y recorra el rea de maternidad. ? Pida un permiso de maternidad o paternidad a sus empleadores. ? Organice para que algn familiar o amigo cuide a sus mascotas mientras usted est en el hospital. ? Compre un asiento de seguridad orientado hacia atrs, y asegrese de saber cmo instalarlo en su automvil. ? Prepare el bolso que llevar al hospital. ? Prepare la habitacin del beb. Asegrese de quitar todas las almohadas y animales   de peluche de la cuna del beb para evitar la asfixia.  Visite a su dentista si no lo ha hecho durante el embarazo. Use un cepillo de dientes blando para higienizarse los dientes y psese el hilo dental con suavidad. Comunquese con un mdico si:  No est segura de que est en trabajo de parto o de que ha roto la bolsa de las aguas.  Se siente mareada.  Siente clicos leves, presin en la pelvis o dolor persistente en el abdomen.  Siente dolor en la parte inferior de la espalda.  Tiene nuseas, vmitos o diarrea persistentes.  Observa una secrecin vaginal inusual o con mal olor.  Siente dolor al orinar. Solicite ayuda de inmediato si:  Rompe la bolsa de las aguas antes de la semana 37.  Tiene contracciones regulares en intervalos de menos de 5 minutos antes de la semana 37.  Tiene fiebre.  Tiene una prdida de lquido por la vagina.  Tiene sangrado o pequeas prdidas vaginales.  Tiene dolor o clicos  abdominales intensos.  Baja de peso o sube de peso rpidamente.  Tiene dificultad para respirar y siente dolor de pecho.  Sbitamente se le hinchan mucho el rostro, las manos, los tobillos, los pies o las piernas.  Su beb se mueve menos de 10 veces en 2 horas.  Siente un dolor de cabeza intenso que no se alivia al tomar medicamentos.  Nota cambios en la visin. Resumen  El tercer trimestre comprende desde la semana28 hasta la semana40, es decir, desde el mes7 hasta el mes9. El tercer trimestre es un perodo en el que el beb en gestacin (feto) crece rpidamente.  Durante el tercer trimestre, su incomodidad puede aumentar a medida que usted y su beb continan aumentando de peso. Es posible que tenga dolor abdominal, en las piernas y en la espalda, problemas para dormir y una mayor necesidad de orinar.  Durante el tercer trimestre, sus pechos seguirn creciendo y se pondrn cada vez ms sensibles. Un lquido amarillo (calostro) puede salir de sus pechos. Esta es la primera leche que usted produce para su beb.  El falso trabajo de parto es una afeccin en la que se sienten pequeos e irregulares espasmos de los msculos del tero (contracciones) que a la larga desaparecen. Estas contracciones se llaman contracciones de Braxton Hicks. Las contracciones pueden durar horas, das o incluso semanas, antes de que el verdadero trabajo de parto se inicie.  Los signos del trabajo de parto pueden incluir: calambres abdominales; contracciones regulares que comienzan en intervalos de 10 minutos y se vuelven ms fuertes y ms frecuentes con el tiempo; una secrecin de mucosidad acuosa o con sangre que sale de la vagina; aumento de la presin en la pelvis y dolor latente en la espalda; y prdida de lquido amnitico. Esta informacin no tiene como fin reemplazar el consejo del mdico. Asegrese de hacerle al mdico cualquier pregunta que tenga. Document Revised: 05/15/2016 Document Reviewed:  05/15/2016 Elsevier Patient Education  2020 Elsevier Inc.  

## 2019-09-10 LAB — URINE CYTOLOGY ANCILLARY ONLY
Chlamydia: NEGATIVE
Comment: NEGATIVE
Comment: NEGATIVE
Comment: NORMAL
Neisseria Gonorrhea: NEGATIVE
Trichomonas: NEGATIVE

## 2019-09-24 ENCOUNTER — Other Ambulatory Visit: Payer: Self-pay

## 2019-09-24 ENCOUNTER — Ambulatory Visit (INDEPENDENT_AMBULATORY_CARE_PROVIDER_SITE_OTHER): Payer: Self-pay | Admitting: Obstetrics and Gynecology

## 2019-09-24 ENCOUNTER — Encounter: Payer: Self-pay | Admitting: Obstetrics and Gynecology

## 2019-09-24 VITALS — BP 110/70 | HR 76 | Wt 219.6 lb

## 2019-09-24 DIAGNOSIS — Z789 Other specified health status: Secondary | ICD-10-CM

## 2019-09-24 DIAGNOSIS — Z3A33 33 weeks gestation of pregnancy: Secondary | ICD-10-CM

## 2019-09-24 DIAGNOSIS — Z3400 Encounter for supervision of normal first pregnancy, unspecified trimester: Secondary | ICD-10-CM

## 2019-09-24 NOTE — Patient Instructions (Addendum)
Prueba de deteccin de estreptococos del grupo B durante el embarazo Group B Streptococcus Test During Pregnancy Por qu me debo realizar esta prueba? Se recomienda realizar pruebas de rutina, tambin llamadas pruebas de deteccin, para estreptococos del grupo B (EGB) entre la semana 36 y 37 de Psychiatrist. Los EGB pertenecen a un tipo de bacteria que puede transmitirse de la madre al beb durante el parto. Las pruebas de Financial controller a Chief Strategy Officer si usted Pension scheme manager o no tratamiento durante el [place] de parto y [place] para evitar complicaciones como:  Una infeccin en el tero durante el [place] de [place].  Una infeccin en el tero despus del parto.  Una infeccin grave en el beb despus del parto, como neumona, meningitis o sepsis. En general, la prueba de deteccin de EGB no se realiza antes de las [address] de [address], a menos que comience el trabajo de parto prematuramente. Qu sucede si tengo estreptococos del grupo B? Si las pruebas [employer] tiene EGB, Administrator tratamiento con antibiticos intravenosos durante el [place] de parto y [place]. Este tratamiento disminuye significativamente el riesgo de complicaciones para usted y el beb. Si tiene una cesrea planificada y tiene EGB, es posible que no necesite tratamiento con antibiticos porque los EGB se transmiten a los bebs generalmente despus de que comienza el [place] de parto y se rompe la bolsa de [place]. Si est en trabajo de parto o rompe la bolsa de aguas antes de la cesrea, es posible que los EGB se introduzcan en el tero y pasen al beb; en tal caso podra necesitar tratamiento. Existe la posibilidad de que no necesite hacerme pruebas? No es necesario que se le realice una prueba de deteccin de EGB si:  Tiene un anlisis de orina que [place] EGB antes de la semana 36 a 37.  Tuvo un beb con infeccin por EGB despus de un parto anterior. En [employer], se la tratar automticamente para los  EGB durante el [place] de parto y [place]. Qu se analiza? Esta prueba se realiza para verificar si tiene estreptococos del grupo B en la vagina o el recto. Qu tipo de [place] se toma? Para obtener muestras para esta prueba, Public librarian un exudado vaginal y rectal con un hisopo de algodn. Luego, la muestra se enva al laboratorio para determinar si hay EGB. Qu ocurre durante la prueba?   Usted se Chief Strategy Officer ropa de la cintura [place].  Deber acostarse en una camilla en la misma posicin que lo hara para un examen plvico.  El Production manager un exudado vaginal y rectal con un hisopo de algodn para una prueba de cultivo.  Podr irse a casa y Education officer, environmental sus actividades habituales inmediatamente despus de los estudios. Cmo se informan los resultados? Los [employer] de la prueba se informan como positivos o negativos. Qu significan los resultados?  Una prueba positiva significa que usted tiene riesgo de [employer] EGB al beb durante el [place] de parto y [place]. El Market researcher tratamiento con antibiticos intravenosos durante el [place] de parto y [place].  Una prueba con resultado negativo significa que tiene un riesgo muy bajo de transmitirle los EGB al beb. An existe un riesgo bajo de transmitir los EGB al beb porque a veces, los [employer] de la prueba pueden informar que usted no tiene una afeccin cuando realmente la tiene (resultado falso-negativo) o existe la posibilidad de que pueda infectarse con los EGB despus de la realizacin  de la prueba. Es muy probable que no necesite tratamiento con un antibitico durante el trabajo de parto y el parto. Hable con su mdico sobre lo que significan sus resultados. Preguntas para hacerle al mdico Consulte a su mdico o pregunte en el departamento donde se realiza la prueba acerca de lo siguiente:  Cundo estarn disponibles mis resultados?  Cmo obtendr mis resultados?  Cules son  las opciones de tratamiento? Resumen  Se recomienda a todas las mujeres embarazadas la realizacin de pruebas de rutina (pruebas de deteccin) para la deteccin de estreptococos del grupo B (EGB) entre la semana 36 y 37 de embarazo.  Los EGB pertenecen a un tipo de bacteria que puede transmitirse de la madre al beb durante el parto.  Si las pruebas muestran que tiene EGB, el mdico le recomendar tratamiento con antibiticos intravenosos durante el trabajo de parto y el parto. Este tratamiento casi siempre evita la infeccin en los recin nacidos. Esta informacin no tiene como fin reemplazar el consejo del mdico. Asegrese de hacerle al mdico cualquier pregunta que tenga. Document Revised: 03/05/2018 Document Reviewed: 03/05/2018 Elsevier Patient Education  2020 Elsevier Inc.  

## 2019-09-24 NOTE — Progress Notes (Signed)
   LOW-RISK PREGNANCY OFFICE VISIT Patient name: Alison Martin MRN [id number]  Date of birth: [birth year] Chief Complaint:   Routine Prenatal Visit  History of Present Illness:   Alison Martin is a 24 y.o. G1P0 female at [redacted]w[redacted]d with an Estimated Date of Delivery: 11/11/19 being seen today for ongoing management of a low-risk pregnancy.  Today she reports occasional contractions, but "only 1-2 times per week." Contractions: Not present. Vag. Bleeding: None.  Movement: Present. denies leaking of fluid. Review of Systems:   Pertinent items are noted in HPI Denies abnormal vaginal discharge w/ itching/odor/irritation, headaches, visual changes, shortness of breath, chest pain, abdominal pain, severe nausea/vomiting, or problems with urination or bowel movements unless otherwise stated above. Pertinent History Reviewed:  Reviewed past medical,surgical, social, obstetrical and family history.  Reviewed problem list, medications and allergies. Physical Assessment:   Vitals:   09/24/19 0939  BP: 110/70  Pulse: 76  Weight: 219 lb 9.6 oz (99.6 kg)  Body mass index is 33.39 kg/m.        Physical Examination:   General appearance: Well appearing, and in no distress  Mental status: Alert, oriented to person, place, and time  Skin: Warm & dry  Cardiovascular: Normal heart rate noted  Respiratory: Normal respiratory effort, no distress  Abdomen: Soft, gravid, nontender  Pelvic: Cervical exam deferred         Extremities: Edema: None  Fetal Status: Fetal Heart Rate (bpm): 138 Fundal Height: 35 cm Movement: Present    No results found for this or any previous visit (from the past 24 hour(s)).  Assessment & Plan:  1) Low-risk pregnancy G1P0 at [redacted]w[redacted]d with an Estimated Date of Delivery: 11/11/19   2) Supervision of normal first pregnancy, antepartum - Discussed GBS testing next visit - Map given to [facility]  3) [redacted] weeks gestation of pregnancy  4) Language barrier affecting health care - [facility]  Language Services Video Spanish Interpreter, Annabelle Harman [phone] used for entire visit   Meds: No orders of the defined types were placed in this encounter.  Labs/procedures today: none  Plan:  Continue routine obstetrical care   Reviewed: Preterm labor symptoms and general obstetric precautions including but not limited to vaginal bleeding, contractions, leaking of fluid and fetal movement were reviewed in detail with the patient.  All questions were answered.   Follow-up: Return in about 3 weeks (around 10/15/2019) for Return OB w/GBS.  No orders of the defined types were placed in this encounter.  Raelyn Mora MSN, CNM 09/24/2019

## 2019-10-15 ENCOUNTER — Other Ambulatory Visit: Payer: Self-pay

## 2019-10-15 ENCOUNTER — Other Ambulatory Visit (HOSPITAL_COMMUNITY)
Admission: RE | Admit: 2019-10-15 | Discharge: 2019-10-15 | Disposition: A | Discharge status: Home / Self Care | Payer: Self-pay | Source: Ambulatory Visit | Attending: Medical | Admitting: Medical

## 2019-10-15 ENCOUNTER — Encounter: Payer: Self-pay | Admitting: Medical

## 2019-10-15 ENCOUNTER — Ambulatory Visit (INDEPENDENT_AMBULATORY_CARE_PROVIDER_SITE_OTHER): Payer: Self-pay | Admitting: Medical

## 2019-10-15 VITALS — BP 120/73 | HR 104 | Temp 97.9°F | Wt 224.2 lb

## 2019-10-15 DIAGNOSIS — O98313 Other infections with a predominantly sexual mode of transmission complicating pregnancy, third trimester: Secondary | ICD-10-CM

## 2019-10-15 DIAGNOSIS — A568 Sexually transmitted chlamydial infection of other sites: Secondary | ICD-10-CM | POA: Insufficient documentation

## 2019-10-15 DIAGNOSIS — B373 Candidiasis of vulva and vagina: Secondary | ICD-10-CM

## 2019-10-15 DIAGNOSIS — Z3A36 36 weeks gestation of pregnancy: Secondary | ICD-10-CM

## 2019-10-15 DIAGNOSIS — Z3400 Encounter for supervision of normal first pregnancy, unspecified trimester: Secondary | ICD-10-CM | POA: Insufficient documentation

## 2019-10-15 DIAGNOSIS — B3731 Acute candidiasis of vulva and vagina: Secondary | ICD-10-CM

## 2019-10-15 HISTORY — DX: Sexually transmitted chlamydial infection of other sites: A56.8

## 2019-10-15 HISTORY — DX: Other infections with a predominantly sexual mode of transmission complicating pregnancy, third trimester: O98.313

## 2019-10-15 NOTE — Patient Instructions (Signed)
Evaluacin de los movimientos fetales Fetal Movement Counts Nombre del paciente: ________________________________________________ Alison Martin estimada: ____________________ Alison Martin evaluacin de los movimientos fetales?  Una evaluacin de los movimientos fetales es el registro del nmero de veces que siente que el beb se mueve durante un cierto perodo de Alison Martin. Esto tambin se puede denominar recuento de patadas fetales. Una evaluacin de movimientos fetales se recomienda a todas las embarazadas. Es posible que le indiquen que comience a Development worker, community los movimientos fetales desde la semana 28 de Alison Martin. Preste atencin cuando sienta que el beb est ms activo. Podr detectar los ciclos en que el beb duerme y est despierto. Tambin podr detectar que ciertas cosas hacen que su beb se mueva ms. Deber realizar una evaluacin de los movimientos fetales en las siguientes situaciones:  Cuando el beb est ms activo habitualmente.  A la [name], todos los [name]. Un buen momento para evaluar los movimientos fetales es cuando est descansando, despus de haber comido y bebido algo. Cmo debo contar los movimientos fetales? 1. Encuentre un lugar tranquilo y cmodo. Sintese o acustese de lado. 2. Anote la fecha, la hora de inicio y de finalizacin y la cantidad de movimientos que sinti entre esas dos horas. Lleve esta informacin a las visitas de control. 3. Anote la hora de inicio cuando Designer, industrial/product. 4. Cuente las pataditas, revoloteos, chasquidos, vueltas o pinchazos. Debe sentir al menos . 5. Puede dejar de contar despus de haber sentido 10 movimientos o de haber contado [employer]. Anote la hora de finalizacin. 6. Si no siente en 2horas, comunquese con su mdico para obtener ms indicaciones. Es posible que el mdico quiera realizar estudios adicionales para Company secretary del beb. Comunquese con un mdico si:  Siente  menos de en 2horas.  El beb no se mueve tanto como suele hacerlo. Fecha: ____________ Alison Martin inicio: ____________ Alison Martin finalizacin: ____________ Movimientos: ____________ Alison Martin: ____________ Alison Martin inicio: ____________ Alison Martin finalizacin: ____________ Movimientos: ____________ Alison Martin: ____________ Alison Martin inicio: ____________ Alison Martin finalizacin: ____________ Movimientos: ____________ Alison Martin: ____________ Alison Martin inicio: ____________ Alison Martin finalizacin: ____________ Movimientos: ____________ Alison Martin: ____________ Alison Martin inicio: ____________ Alison Martin de finalizacin: ____________ Movimientos: ____________ Alison Martin: ____________ Alison Martin inicio: ____________ Alison Martin de finalizacin: ____________ Movimientos: ____________ Alison Martin: ____________ Alison Martin inicio: ____________ Alison Martin de finalizacin: ____________ Movimientos: ____________ Alison Martin: ____________ Alison Martin inicio: ____________ Alison Martin finalizacin: ____________ Movimientos: ____________ Alison Martin: ____________ Alison Martin inicio: ____________ Alison Martin de finalizacin: ____________ Movimientos: ____________ Esta informacin no tiene como fin reemplazar el consejo del mdico. Asegrese de hacerle al mdico cualquier pregunta que tenga. Document Revised: 10/28/2018 Document Reviewed: 10/28/2018 Elsevier Patient Education  2020 [employer]. [employer] de Braxton Hicks Braxton Hicks Contractions Las contracciones del tero pueden presentarse durante todo el [place], [place] no siempre indican que la mujer est de [place]. Es posible que usted haya tenido contracciones de prctica llamadas "contracciones de [place]". A veces, se las confunde con el parto real. Qu son las contracciones de [place]? Las contracciones de [place] son espasmos que se producen en los msculos del tero antes del [place]. A diferencia de las contracciones del parto verdadero, estas no producen el agrandamiento (la dilatacin) ni el afinamiento del cuello  uterino. Hacia el final del embarazo [facility] las semanas [phone]), las contracciones de Braxton Hicks pueden presentarse ms seguido y tornarse ms intensas. A veces, resulta difcil distinguirlas del parto verdadero porque pueden ser [employer]. No debe sentirse avergonzada si concurre al  hospital con falso parto. En ocasiones, la nica forma de saber si el trabajo de parto es verdadero es que el mdico determine si hay cambios en el cuello del tero. El mdico le har un examen fsico y quizs le controle las contracciones. Si usted no est de parto verdadero, el examen debe indicar que el cuello uterino no est dilatado y que usted no ha roto bolsa. Si no hay otros problemas de salud asociados con su embarazo, no habr inconvenientes si la envan a su casa con un falso parto. Es posible que las contracciones de Braxton Hicks continen hasta que se desencadene el parto verdadero. Cmo diferenciar el trabajo de parto falso del verdadero Trabajo de parto verdadero  Las contracciones duran de 30a70segundos.  Las contracciones pueden tornarse muy regulares.  La molestia generalmente se siente en la parte superior del tero y se extiende hacia la zona baja del abdomen y hacia la cintura.  Las contracciones no desaparecen cuando usted camina.  Las contracciones generalmente se hacen ms intensas y aumentan en frecuencia.  El cuello uterino se dilata y se afina. Parto falso  En general, las contracciones son ms cortas y no tan intensas como las del parto verdadero.  En general, las contracciones son irregulares.  A menudo, las contracciones se sienten en la parte delantera de la parte baja del abdomen y en la ingle.  Las contracciones pueden desaparecer cuando usted camina o cambia de posicin mientras est acostada.  Las contracciones se vuelven ms dbiles y su duracin es menor a medida que transcurre el tiempo.  En general, el cuello uterino no se dilata ni se afina. Siga estas  indicaciones en su casa:   Tome los medicamentos de venta libre y los recetados solamente como se lo haya indicado el mdico.  Contine haciendo los ejercicios habituales y siga las dems indicaciones que el mdico le d.  Coma y beba con moderacin si cree que est de parto.  Si las contracciones de Braxton Hicks le provocan incomodidad: ? Cambie de posicin: si est acostada o descansando, camine; si est caminando, descanse. ? Sintese y descanse en una baera con agua tibia. ? Beba suficiente lquido como para mantener la orina de color amarillo plido. La deshidratacin puede provocar contracciones. ? Respire lenta y profundamente varias veces por hora.  Vaya a todas las visitas de control prenatales y de control como se lo haya indicado el mdico. Esto es importante. Comunquese con un mdico si:  Tiene fiebre.  Siente dolor constante en el abdomen. Solicite ayuda de inmediato si:  Las contracciones se intensifican, se hacen ms regulares y cercanas entre s.  Tiene una prdida de lquido por la vagina.  Elimina una mucosidad sanguinolenta (prdida del tapn mucoso).  Tiene una hemorragia vaginal.  Tiene un dolor en la zona lumbar que nunca tuvo antes.  Siente que la cabeza del beb empuja hacia abajo y ejerce presin en la zona plvica.  El beb no se mueve tanto como antes. Resumen  Las contracciones que se presentan antes del parto se conocen como contracciones de Braxton Hicks, falso parto o contracciones de prctica.  En general, las contracciones de Braxton Hicks son ms cortas, ms dbiles, con ms tiempo entre una y otra, y menos regulares que las contracciones del parto verdadero. Las contracciones del parto verdadero se intensifican progresivamente y se tornan regulares y ms frecuentes.  Para controlar la molestia que producen las contracciones de Braxton Hicks, puede cambiar de posicin, darse un bao templado y   descansar, beber mucha agua o practicar la  respiracin profunda. Esta informacin no tiene Theme park manager el consejo del mdico. Asegrese de hacerle al mdico cualquier pregunta que tenga. Document Revised: 04/12/2017 Document Reviewed: 08/13/2016 Elsevier Patient Education  2020 [employer].

## 2019-10-15 NOTE — Progress Notes (Signed)
   PRENATAL VISIT NOTE  Subjective:  Alison Martin is a 24 y.o. G1P0 at [redacted]w[redacted]d being seen today for ongoing prenatal care.  She is currently monitored for the following issues for this low-risk pregnancy and has Supervision of normal first pregnancy, antepartum and Chlamydia trachomatis infection in pregnancy in third trimester on their problem list.  Patient reports occasional contractions.  Contractions: Irregular. Vag. Bleeding: None.  Movement: Present. Denies leaking of fluid.   The following portions of the patient's history were reviewed and updated as appropriate: allergies, current medications, past family history, past medical history, past social history, past surgical history and problem list.   Objective:   Vitals:   10/15/19 1418  BP: 120/73  Pulse: (!) 104  Temp: 97.9 F (36.6 C)  Weight: 224 lb 3.2 oz (101.7 kg)    Fetal Status: Fetal Heart Rate (bpm): 145 Fundal Height: 38 cm Movement: Present  Presentation: Vertex  General:  Alert, oriented and cooperative. Patient is in no acute distress.  Skin: Skin is warm and dry. No rash noted.   Cardiovascular: Normal heart rate noted  Respiratory: Normal respiratory effort, no problems with respiration noted  Abdomen: Soft, gravid, appropriate for gestational age.  Pain/Pressure: Present     Pelvic: Cervical exam performed in the presence of a chaperone Dilation: Closed Effacement (%): 50 Station: -2  Extremities: Normal range of motion.  Edema: None  Mental Status: Normal mood and affect. Normal behavior. Normal judgment and thought content.   Assessment and Plan:  Pregnancy: G1P0 at [redacted]w[redacted]d 1. Supervision of normal first pregnancy, antepartum - Cervicovaginal ancillary only( [place]) - Culture, beta strep (group b only) - Encouraged to get Tdap at [facility] ASAP, has Rx  2. Chlamydia trachomatis infection in pregnancy in third trimester - Cervicovaginal ancillary only( [place]) - Confirmed treatment taken early  August. TOC today.   3. [redacted] weeks gestation of pregnancy  Preterm labor symptoms and general obstetric precautions including but not limited to vaginal bleeding, contractions, leaking of fluid and fetal movement were reviewed in detail with the patient. Please refer to After Visit Summary for other counseling recommendations.   Return in about 1 week (around 10/22/2019) for LOB, In-Person.  No future appointments.  Vonzella Nipple, PA-C

## 2019-10-16 LAB — CERVICOVAGINAL ANCILLARY ONLY
Bacterial Vaginitis (gardnerella): NEGATIVE
Candida Glabrata: NEGATIVE
Candida Vaginitis: POSITIVE — AB
Chlamydia: POSITIVE — AB
Comment: NEGATIVE
Comment: NEGATIVE
Comment: NEGATIVE
Comment: NEGATIVE
Comment: NEGATIVE
Comment: NORMAL
Neisseria Gonorrhea: NEGATIVE
Trichomonas: NEGATIVE

## 2019-10-16 MED ORDER — MICONAZOLE NITRATE 2 % VA CREA: 1.0000 | TOPICAL_CREAM | Freq: Every day | VAGINAL | 0 refills | Status: DC

## 2019-10-16 MED ORDER — AZITHROMYCIN 250 MG PO TABS: 1000.0000 mg | ORAL_TABLET | Freq: Once | ORAL | 0 refills | Status: AC

## 2019-10-16 NOTE — Addendum Note (Signed)
Addended by: Marny Lowenstein on: 10/16/2019 02:48 PM   Modules accepted: Orders

## 2019-10-18 LAB — CULTURE, BETA STREP (GROUP B ONLY): Strep Gp B Culture: NEGATIVE

## 2019-10-21 ENCOUNTER — Encounter: Payer: Self-pay | Admitting: Certified Nurse Midwife

## 2019-10-23 ENCOUNTER — Telehealth: Payer: Self-pay | Admitting: *Deleted

## 2019-10-23 NOTE — Telephone Encounter (Signed)
Left voice message for patient to return nurse call regarding test results/medication. AM[employer] Spanish interpreter: Reginia Forts ID [phone].  Clovis Pu, RN

## 2019-10-23 NOTE — Telephone Encounter (Signed)
-----   Message from Marny Lowenstein, PA-C sent at 10/16/2019  2:48 PM EDT ----- This patient still has Chlamydia. She doesn't have MyChart, so please try to get a hold of her to pick up Rx sent. She also needs to be sure abstain from intercourse until the baby is born. She also has a yeast infection. I sent that Rx too.   Vonzella Nipple, PA-C 10/16/2019 2:47 PM

## 2019-10-27 NOTE — Telephone Encounter (Signed)
Left voice message using [employer] Interpreter ID: [id number] for patient to return nurse call regarding test results. STD report sent to [facility].   Left voice message for STD Nurse to call clinic regarding patient.  Clovis Pu, RN

## 2019-10-29 ENCOUNTER — Encounter: Payer: Self-pay | Admitting: Obstetrics and Gynecology

## 2019-10-29 ENCOUNTER — Ambulatory Visit (INDEPENDENT_AMBULATORY_CARE_PROVIDER_SITE_OTHER): Payer: Self-pay | Admitting: Obstetrics and Gynecology

## 2019-10-29 ENCOUNTER — Other Ambulatory Visit: Payer: Self-pay

## 2019-10-29 ENCOUNTER — Encounter: Payer: Self-pay | Admitting: General Practice

## 2019-10-29 VITALS — BP 113/71 | HR 130 | Temp 98.0°F | Wt 228.6 lb

## 2019-10-29 DIAGNOSIS — Z3A38 38 weeks gestation of pregnancy: Secondary | ICD-10-CM

## 2019-10-29 DIAGNOSIS — Z789 Other specified health status: Secondary | ICD-10-CM

## 2019-10-29 DIAGNOSIS — O98313 Other infections with a predominantly sexual mode of transmission complicating pregnancy, third trimester: Secondary | ICD-10-CM

## 2019-10-29 DIAGNOSIS — Z3400 Encounter for supervision of normal first pregnancy, unspecified trimester: Secondary | ICD-10-CM

## 2019-10-29 DIAGNOSIS — A568 Sexually transmitted chlamydial infection of other sites: Secondary | ICD-10-CM

## 2019-10-29 MED ORDER — AZITHROMYCIN 500 MG PO TABS
1000.0000 mg | ORAL_TABLET | Freq: Once | ORAL | Status: AC
  Administered 2019-10-29: 1000 mg via ORAL

## 2019-10-29 NOTE — Patient Instructions (Signed)
Signos y sntomas del trabajo de parto Signs and Symptoms of Labor El trabajo de parto es el proceso natural del cuerpo por el cual se saca al beb, la placenta y el cordn umbilical del tero. Por lo general, el proceso del trabajo de parto comienza cuando el embarazo ha llegado a su trmino, entre 37 y 40 semanas de embarazo. Cmo sabr que estoy prxima a comenzar el trabajo de parto? A medida que el cuerpo se prepara para el trabajo de parto y el nacimiento del beb, puede notar los siguientes sntomas en las semanas y das anteriores al trabajo de parto propiamente dicho:  Un fuerte deseo de preparar su casa para recibir al nuevo beb. Esto se denomina anidacin. La anidacin puede ser un signo de que se est acercando el trabajo de parto, y puede ocurrir varias semanas antes del nacimiento. La anidacin puede implicar limpiar y organizar la casa.  Una pequea cantidad de mucosidad espesa y con sangre sale de la vagina (aparicin normal de sangre o prdida del tapn mucoso). Esto puede suceder ms de una semana antes de que comience el trabajo de parto, o puede ocurrir justo antes de que comience el trabajo de parto a medida que el cuello uterino comienza a ensancharse (dilatarse). En algunas mujeres, el tapn mucoso sale entero de una sola vez. En otras, pueden salir pequeas partes del tapn mucoso de forma gradual durante varios das.  El beb se mueve (desciende) a la parte inferior de la pelvis para ponerse en posicin para el nacimiento (aligeramiento). Cuando esto sucede, puede sentir ms presin en la vejiga y el hueso plvico, y menos presin en las costillas. Esto facilitar la respiracin. Tambin puede hacer que necesite orinar con ms frecuencia y que tenga problemas para hacer de vientre.  Tener "contracciones de prctica" (contracciones de Braxton Hicks) que ocurren a intervalos irregulares (espaciadas de modo desigual) con una diferencia de ms de 10 minutos. Esto tambin se  denomina trabajo de parto falso. Las contracciones de trabajo de parto falso son comunes luego del ejercicio o la actividad sexual, y se detendrn si cambia de posicin, descansa o toma lquidos. Estas contracciones son generalmente leves y no se tornan ms fuertes con el tiempo. Pueden sentirse como lo siguiente: ? Un dolor de espalda. ? Calambres leves, similares a los dolores menstruales. ? Tirantez o presin en el abdomen. Otros sntomas tempranos de que el trabajo de parto puede comenzar pronto incluyen:  Nuseas o prdida del apetito.  Diarrea.  Un repentino estallido de energa o sentirse muy cansada.  Cambios en el humor.  Problemas para dormir. Cmo sabr cuando ha comenzado el trabajo de parto? Los signos de que ha comenzado el trabajo de parto verdadero pueden incluir:  Contracciones a intervalos regulares (espaciadas de modo regular) que se incrementan en intensidad. Esto puede sentirse como presin o estrechamiento intenso en el abdomen, que se desplaza hacia la espalda. ? Las contracciones pueden sentirse tambin como dolor rtmico en la parte superior de los muslos y la espalda que va y viene a intervalos regulares. ? Para las madres primerizas, este cambio en intensidad de las contracciones ocurre generalmente a un ritmo ms gradual. ? Las mujeres que ya han sido madres pueden notar una progresin ms rpida de los cambios de las contracciones.  Una sensacin de presin en el rea vaginal.  Ruptura de la bolsa (ruptura de las membranas). Es cuando el saco de lquido que rodea al beb se rompe. Cuando esto sucede, notar que le   sale lquido de la vagina. Este puede ser claro o estar manchado de sangre. Generalmente el trabajo de parto comienza 24 horas despus de la ruptura de bolsa, pero puede tomar ms tiempo en comenzar. ? Algunas mujeres notan esto como un chorro de lquido. ? Otras notan que la ropa interior se moja repetidas veces. Siga estas indicaciones en su  casa:   Cuando comience el trabajo de parto o si rompe bolsa, llame al mdico o a la lnea de atencin de enfermera. Ellos, en funcin de su situacin, determinarn cundo debe ir a hacerse un examen.  Si entra en trabajo de parto temprano, es posible que pueda descansar y manejar los sntomas en su casa. Algunas estrategias para probar en su casa incluyen: ? Tcnicas de respiracin y relajacin. ? Tomar una ducha o un bao de inmersin tibios. ? Escuchar msica. ? Usar una almohadilla trmica en la espalda para aliviar el dolor. Si se le indica que use calor:  Coloque una toalla entre la piel y la fuente de calor.  Aplique el calor durante 20 a 30minutos.  Retire la fuente de calor si la piel se pone de color rojo brillante. Esto es muy importante si no puede sentir dolor, calor o fro. Puede correr un riesgo mayor de sufrir quemaduras. Solicite ayuda de inmediato si:  Tiene contracciones dolorosas y regulares cada 5 minutos o menos.  El trabajo de parto comienza antes de que se cumplan las 37 semanas de embarazo.  Tiene fiebre.  Siente un dolor de cabeza intenso que no se alivia.  Elimina cogulos de sangre de color rojo brillante por la vagina.  No siente que el beb se mueva.  Experimenta la aparicin repentina de: ? Dolor de cabeza intenso con problemas de la visin. ? Nuseas, vmitos o diarrea. ? Dolor en el pecho o falta de aire. Estos sntomas pueden indicar una emergencia. Si el mdico recomienda que vaya al hospital o al centro de nacimientos donde va a dar a luz, no conduzca usted misma. Pdale a otra persona que conduzca o llame a los servicios de emergencia (911 en los [place]) Resumen  El trabajo de parto es el proceso natural del cuerpo por el cual se saca al beb, la placenta y el cordn umbilical del tero.  Por lo general, el proceso del trabajo de parto comienza cuando el embarazo ha llegado a su trmino, entre 37 y 40 semanas de embarazo.  Cuando  comience el trabajo de parto o si rompe bolsa, llame al mdico o a la lnea de atencin de enfermera. Ellos, en funcin de su situacin, determinarn cundo debe ir a hacerse un examen. Esta informacin no tiene como fin reemplazar el consejo del mdico. Asegrese de hacerle al mdico cualquier pregunta que tenga. Document Revised: 10/01/2016 Document Reviewed: 09/26/2016 Elsevier Patient Education  2020 Elsevier Inc.  

## 2019-10-29 NOTE — Progress Notes (Signed)
   LOW-RISK PREGNANCY OFFICE VISIT Patient name: Alison Martin MRN [id number]  Date of birth: 09/17/1995 Chief Complaint:   Routine Prenatal Visit  History of Present Illness:   Alison Martin is a 24 y.o. G1P0 female at [redacted]w[redacted]d with an Estimated Date of Delivery: 11/11/19 being seen today for ongoing management of a low-risk pregnancy.  Today she reports occasional contractions and some pelvic pressure. Has not gone to [facility] to get TdaP vaccine. Vaginal test (+) CT on 10/15/19; did not pick up Rx. Partner has not been treated. Contractions: Not present. Vag. Bleeding: None.  Movement: Present. denies leaking of fluid. Review of Systems:   Pertinent items are noted in HPI Denies abnormal vaginal discharge w/ itching/odor/irritation, headaches, visual changes, shortness of breath, chest pain, abdominal pain, severe nausea/vomiting, or problems with urination or bowel movements unless otherwise stated above. Pertinent History Reviewed:  Reviewed past medical,surgical, social, obstetrical and family history.  Reviewed problem list, medications and allergies. Physical Assessment:   Vitals:   10/29/19 1025  BP: 113/71  Pulse: (!) 130  Temp: 98 F (36.7 C)  Weight: 228 lb 9.6 oz (103.7 kg)  Body mass index is 34.76 kg/m.        Physical Examination:   General appearance: Well appearing, and in no distress  Mental status: Alert, oriented to person, place, and time  Skin: Warm & dry  Cardiovascular: Normal heart rate noted  Respiratory: Normal respiratory effort, no distress  Abdomen: Soft, gravid, nontender  Pelvic: Cervical exam deferred         Extremities: Edema: None  Fetal Status: Fetal Heart Rate (bpm): 137   Movement: Present    No results found for this or any previous visit (from the past 24 hour(s)).  Assessment & Plan:  1) Low-risk pregnancy G1P0 at [redacted]w[redacted]d with an Estimated Date of Delivery: 11/11/19   2) Supervision of normal first pregnancy, antepartum - Reassurance given  that some UC's are normal at this gestation  3) Chlamydia trachomatis infection in pregnancy in third trimester  - Azithromycin (ZITHROMAX) tablet 1,000 mg given in office - Expedited partner therapy -- Rx given to patient for Tedra [place] - Advised to have partner take medication all at once - Advised to abstain from [facility] until after cleared in postpartum period  4) [redacted] weeks gestation of pregnancy  5) Language barrier affecting health care - [employer] Language Services Video Spanish [place], [place] #[id number] used for entire visit   Meds:  Meds ordered this encounter  Medications  . azithromycin (ZITHROMAX) tablet 1,000 mg   Labs/procedures today: Given Azithromycin 1 gram   Plan:  Continue routine obstetrical care   Reviewed: Term labor symptoms and general obstetric precautions including but not limited to vaginal bleeding, contractions, leaking of fluid and fetal movement were reviewed in detail with the patient.  All questions were answered.    Follow-up: Return in about 1 week (around 11/05/2019) for Return OB visit.  No orders of the defined types were placed in this encounter.  Raelyn Mora MSN, CNM 10/29/2019

## 2019-11-04 ENCOUNTER — Encounter: Payer: Self-pay | Admitting: Certified Nurse Midwife

## 2019-11-04 ENCOUNTER — Other Ambulatory Visit: Payer: Self-pay

## 2019-11-04 ENCOUNTER — Ambulatory Visit (INDEPENDENT_AMBULATORY_CARE_PROVIDER_SITE_OTHER): Payer: Self-pay | Admitting: Certified Nurse Midwife

## 2019-11-04 VITALS — BP 121/80 | HR 89 | Temp 97.9°F | Wt 230.4 lb

## 2019-11-04 DIAGNOSIS — Z3400 Encounter for supervision of normal first pregnancy, unspecified trimester: Secondary | ICD-10-CM

## 2019-11-04 DIAGNOSIS — A568 Sexually transmitted chlamydial infection of other sites: Secondary | ICD-10-CM

## 2019-11-04 DIAGNOSIS — O98313 Other infections with a predominantly sexual mode of transmission complicating pregnancy, third trimester: Secondary | ICD-10-CM

## 2019-11-04 DIAGNOSIS — Z3A39 39 weeks gestation of pregnancy: Secondary | ICD-10-CM

## 2019-11-04 NOTE — Progress Notes (Signed)
   PRENATAL VISIT NOTE  Subjective:  Alison Martin is a 24 y.o. G1P0 at [redacted]w[redacted]d being seen today for ongoing prenatal care.  She is currently monitored for the following issues for this low-risk pregnancy and has Supervision of normal first pregnancy, antepartum and Chlamydia trachomatis infection in pregnancy in third trimester on their problem list.  Patient reports occasional contractions.  Contractions: Irregular. Vag. Bleeding: None.  Movement: Present. Denies leaking of fluid.   The following portions of the patient's history were reviewed and updated as appropriate: allergies, current medications, past family history, past medical history, past social history, past surgical history and problem list.   Objective:   Vitals:   11/04/19 1318  BP: 121/80  Pulse: 89  Temp: 97.9 F (36.6 C)  Weight: 230 lb 6.4 oz (104.5 kg)    Fetal Status: Fetal Heart Rate (bpm): 141 Fundal Height: 40 cm Movement: Present     General:  Alert, oriented and cooperative. Patient is in no acute distress.  Skin: Skin is warm and dry. No rash noted.   Cardiovascular: Normal heart rate noted  Respiratory: Normal respiratory effort, no problems with respiration noted  Abdomen: Soft, gravid, appropriate for gestational age.  Pain/Pressure: Present     Pelvic: Cervical exam deferred        Extremities: Normal range of motion.  Edema: None  Mental Status: Normal mood and affect. Normal behavior. Normal judgment and thought content.   Assessment and Plan:  Pregnancy: G1P0 at [redacted]w[redacted]d 1. Supervision of normal first pregnancy, antepartum - Patient doing well, no complaints - routine prenatal care - anticipatory guidance on upcoming appointments with antenatal screening starting next week. Discussed with patient twice weekly NST starting next week with IOL at 41 weeks  - IOL scheduled for 11/3, orders for induction placed  - spanish interpreter on stratus used throughout appointment   2. Chlamydia trachomatis  infection in pregnancy in third trimester - patient reports taking medication last week, has not had intercourse since, partner has not taken medication  - discussed with patient that testing will be performed prior to delivery, patient verbalizes understanding   3. [redacted] weeks gestation of pregnancy - educated and discussed reasons to go to [facility] for evaluation  - labor precautions   Term labor symptoms and general obstetric precautions including but not limited to vaginal bleeding, contractions, leaking of fluid and fetal movement were reviewed in detail with the patient. Please refer to After Visit Summary for other counseling recommendations.   Return in about 1 week (around 11/11/2019) for ROB, NST.  Future Appointments  Date Time Provider Department Center  11/11/2019  1:10 PM Bernerd Limbo, CNM [facility] None    Sharyon Cable, [place]

## 2019-11-04 NOTE — Patient Instructions (Signed)
Razones para volver a MAU:  1. Las contracciones son cada 5 minutos o menos, cada uno de los ltimos 1 minuto, stos han llevado a cabo durante 1-2 horas, y no se puede caminar o hablar durante ellas 2. Usted tiene un gran chorro de lquido o un goteo de lquido que no se detiene y hay que usar una toalla 3. Ha de sangrado que es de color rojo brillante, denso que el manchado - como sangrado menstrual (manchado puede ser normal en trabajo de parto prematuro o despus de una comprobacin de su cuello uterino) 4. Usted no se siente el beb se mueve como l / ella hace normalmente 

## 2019-11-11 ENCOUNTER — Inpatient Hospital Stay (HOSPITAL_COMMUNITY)
Admission: AD | Admit: 2019-11-11 | Discharge: 2019-11-14 | DRG: 806 | Disposition: A | Discharge status: Home / Self Care | Payer: Medicaid Other | Attending: Obstetrics and Gynecology | Admitting: Obstetrics and Gynecology

## 2019-11-11 ENCOUNTER — Encounter (HOSPITAL_COMMUNITY): Payer: Self-pay | Admitting: Family Medicine

## 2019-11-11 ENCOUNTER — Inpatient Hospital Stay (HOSPITAL_COMMUNITY): Admission: AD | Admit: 2019-11-11 | Payer: Self-pay | Source: Home / Self Care | Admitting: Obstetrics & Gynecology

## 2019-11-11 ENCOUNTER — Other Ambulatory Visit: Payer: Self-pay | Admitting: Advanced Practice Midwife

## 2019-11-11 ENCOUNTER — Other Ambulatory Visit (HOSPITAL_COMMUNITY)
Admission: RE | Admit: 2019-11-11 | Discharge: 2019-11-11 | Disposition: A | Discharge status: Home / Self Care | Payer: Medicaid Other | Source: Ambulatory Visit | Attending: Certified Nurse Midwife | Admitting: Certified Nurse Midwife

## 2019-11-11 ENCOUNTER — Encounter: Payer: Self-pay | Admitting: General Practice

## 2019-11-11 ENCOUNTER — Other Ambulatory Visit: Payer: Self-pay

## 2019-11-11 ENCOUNTER — Ambulatory Visit (INDEPENDENT_AMBULATORY_CARE_PROVIDER_SITE_OTHER): Payer: Medicaid Other | Admitting: Certified Nurse Midwife

## 2019-11-11 VITALS — BP 121/73 | HR 88 | Temp 98.0°F | Wt 232.6 lb

## 2019-11-11 DIAGNOSIS — O480 Post-term pregnancy: Secondary | ICD-10-CM | POA: Diagnosis present

## 2019-11-11 DIAGNOSIS — O98313 Other infections with a predominantly sexual mode of transmission complicating pregnancy, third trimester: Secondary | ICD-10-CM

## 2019-11-11 DIAGNOSIS — O26893 Other specified pregnancy related conditions, third trimester: Secondary | ICD-10-CM | POA: Diagnosis present

## 2019-11-11 DIAGNOSIS — Z3403 Encounter for supervision of normal first pregnancy, third trimester: Secondary | ICD-10-CM

## 2019-11-11 DIAGNOSIS — Z3A40 40 weeks gestation of pregnancy: Secondary | ICD-10-CM

## 2019-11-11 DIAGNOSIS — Z23 Encounter for immunization: Secondary | ICD-10-CM

## 2019-11-11 DIAGNOSIS — Z20822 Contact with and (suspected) exposure to covid-19: Secondary | ICD-10-CM | POA: Diagnosis present

## 2019-11-11 DIAGNOSIS — A568 Sexually transmitted chlamydial infection of other sites: Secondary | ICD-10-CM | POA: Diagnosis not present

## 2019-11-11 DIAGNOSIS — Z87891 Personal history of nicotine dependence: Secondary | ICD-10-CM

## 2019-11-11 DIAGNOSIS — Z8759 Personal history of other complications of pregnancy, childbirth and the puerperium: Secondary | ICD-10-CM | POA: Diagnosis not present

## 2019-11-11 LAB — TYPE AND SCREEN
ABO/RH(D): O POS
Antibody Screen: NEGATIVE

## 2019-11-11 LAB — CBC
HCT: 36.9 % (ref 36.0–46.0)
Hemoglobin: 11.9 g/dL — ABNORMAL LOW (ref 12.0–15.0)
MCH: 25.5 pg — ABNORMAL LOW (ref 26.0–34.0)
MCHC: 32.2 g/dL (ref 30.0–36.0)
MCV: 79 fL — ABNORMAL LOW (ref 80.0–100.0)
Platelets: 297 10*3/uL (ref 150–400)
RBC: 4.67 MIL/uL (ref 3.87–5.11)
RDW: 18.8 % — ABNORMAL HIGH (ref 11.5–15.5)
WBC: 12.6 10*3/uL — ABNORMAL HIGH (ref 4.0–10.5)
nRBC: 0 % (ref 0.0–0.2)

## 2019-11-11 MED ORDER — SOD CITRATE-CITRIC ACID 500-334 MG/5ML PO SOLN: 30.0000 mL | ORAL | Status: DC | PRN

## 2019-11-11 MED ORDER — OXYTOCIN BOLUS FROM INFUSION
333.0000 mL | Freq: Once | INTRAVENOUS | Status: AC
  Administered 2019-11-12: 333 mL via INTRAVENOUS

## 2019-11-11 MED ORDER — TERBUTALINE SULFATE 1 MG/ML IJ SOLN: 0.2500 mg | Freq: Once | INTRAMUSCULAR | Status: DC | PRN

## 2019-11-11 MED ORDER — ONDANSETRON HCL 4 MG/2ML IJ SOLN: 4.0000 mg | Freq: Four times a day (QID) | INTRAMUSCULAR | Status: DC | PRN

## 2019-11-11 MED ORDER — ACETAMINOPHEN 325 MG PO TABS: 650.0000 mg | ORAL_TABLET | ORAL | Status: DC | PRN

## 2019-11-11 MED ORDER — LACTATED RINGERS IV SOLN: 500.0000 mL | INTRAVENOUS | Status: DC | PRN

## 2019-11-11 MED ORDER — OXYTOCIN-SODIUM CHLORIDE 30-0.9 UT/500ML-% IV SOLN: 2.5000 [IU]/h | INTRAVENOUS | Status: DC

## 2019-11-11 MED ORDER — LACTATED RINGERS IV SOLN: INTRAVENOUS | Status: DC

## 2019-11-11 MED ORDER — MISOPROSTOL 25 MCG QUARTER TABLET
25.0000 ug | ORAL_TABLET | ORAL | Status: DC | PRN
  Administered 2019-11-11: 25 ug via VAGINAL
  Filled 2019-11-11: qty 1

## 2019-11-11 MED ORDER — LIDOCAINE HCL (PF) 1 % IJ SOLN: 30.0000 mL | INTRAMUSCULAR | Status: DC | PRN

## 2019-11-11 NOTE — Progress Notes (Signed)
   PRENATAL VISIT NOTE  Subjective:  Alison Martin is a 24 y.o. G1P0 at [redacted]w[redacted]d being seen today for ongoing prenatal care.  She is currently monitored for the following issues for this low-risk pregnancy and has Supervision of normal first pregnancy, antepartum and Chlamydia trachomatis infection in pregnancy in third trimester on their problem list.  Patient reports occasional contractions and increased pressure/pelvic pain.  Contractions: Irregular. Vag. Bleeding: None.  Movement: Present. Denies leaking of fluid.   The following portions of the patient's history were reviewed and updated as appropriate: allergies, current medications, past family history, past medical history, past social history, past surgical history and problem list.   Objective:   Vitals:   11/11/19 1318  BP: 121/73  Pulse: 88  Temp: 98 F (36.7 C)  Weight: 232 lb 9.6 oz (105.5 kg)    Fetal Status: Fetal Heart Rate (bpm): 142 Fundal Height: 40 cm Movement: Present  Presentation: Vertex  General:  Alert, oriented and cooperative. Patient is in no acute distress.  Skin: Skin is warm and dry. No rash noted.   Cardiovascular: Normal heart rate noted  Respiratory: Normal respiratory effort, no problems with respiration noted  Abdomen: Soft, gravid, appropriate for gestational age.  Pain/Pressure: Present     Pelvic: Cervical exam performed in the presence of a chaperone Dilation: Closed Effacement (%): 80 Station: -2  Extremities: Normal range of motion.  Edema: None  Mental Status: Normal mood and affect. Normal behavior. Normal judgment and thought content.   Fetal Tracing: non-reactive Baseline: 140 Variability: minimal Accelerations: present, none over 10x10 Decelerations: none Toco: UI  Assessment and Plan:  Pregnancy: G1P0 at [redacted]w[redacted]d 1. Chlamydia trachomatis infection in pregnancy in third trimester - Cervicovaginal ancillary only( [place])  2. Encounter for supervision of normal first pregnancy  in third trimester - Pt doing well, has contractions every morning for the past few days but then stop.  3. [redacted] weeks gestation of pregnancy - NST non-reactive but easily palpable fetal movement - Consulted with Dr. Debroah Loop and sent pt to L&D for direct admit and IOL tonight - L&D team notified (spoke to resident MD, Dr. Arita Miss) - Pt needs to wait for husband to return from work and go to hospital between 5-6pm. Explained that the need is not emergent but she should pay attention to fetal movement and go in sooner if it feels decreased. Pt verbalized understanding.  Term labor symptoms and general obstetric precautions including but not limited to vaginal bleeding, contractions, leaking of fluid and fetal movement were reviewed in detail with the patient. Please refer to After Visit Summary for other counseling recommendations.   Future Appointments  Date Time Provider Department Center  11/18/2019 12:00 AM [facility] SCHED ROOM [facility] None   Edd Arbour, CNM, MSN, [facility] 11/11/19 3:45 PM

## 2019-11-11 NOTE — H&P (Signed)
OBSTETRIC ADMISSION HISTORY AND PHYSICAL  Alison Martin is a 24 y.o. female G1P0 with IUP at [redacted]w[redacted]d by LMP presenting for IOL for nonreactive NST. She reports +FMs, No LOF, no VB, no blurry vision, headaches or peripheral edema, and RUQ pain.  She plans on breast feeding. She request nexplanon for birth control. She received her prenatal care at [facility]   Dating: By LMP --->  Estimated Date of Delivery: 11/11/19  Sono:    @[redacted]w[redacted]d , CWD, normal anatomy, cephalic presentation, 41.2%ile, 729g   Prenatal History/Complications:   --BMI 35 --Chlamydia multiple times in pregnancy, 5/20, 6/23, and again 9/30. Given script on 10/14 and reports taking, however partner did not take expedited therapy. Chlamydia swab obtained at [facility] today, pending. Reports taking medication and abstaining from intercourse since treatment. Denies IPV.    Past Medical History: Past Medical History:  Diagnosis Date  . Medical history non-contributory     Past Surgical History: Past Surgical History:  Procedure Laterality Date  . NO PAST SURGERIES      Obstetrical History: OB History    Gravida  1   Para      Term      Preterm      AB      Living        SAB      TAB      Ectopic      Multiple      Live Births              Social History Social History   Socioeconomic History  . Marital status: Single    Spouse name: Not on file  . Number of children: Not on file  . Years of education: Not on file  . Highest education level: 9th grade  Occupational History  . Not on file  Tobacco Use  . Smoking status: Former 11/14  . Smokeless tobacco: Never Used  . Tobacco comment: stopped about year ago  Vaping Use  . Vaping Use: Never used  Substance and Sexual Activity  . Alcohol use: Not Currently    Comment: socially, none in the last year  . Drug use: Never  . Sexual activity: Yes    Birth control/protection: None  Other Topics Concern  . Not on file  Social History  Narrative  . Not on file   Social Determinants of Health   Financial Resource Strain:   . Difficulty of Paying Living Expenses: Not on file  Food Insecurity:   . Worried About Games developer in the Last Year: Not on file  . Ran Out of Food in the Last Year: Not on file  Transportation Needs:   . Lack of Transportation (Medical): Not on file  . Lack of Transportation (Non-Medical): Not on file  Physical Activity:   . Days of Exercise per Week: Not on file  . Minutes of Exercise per Session: Not on file  Stress:   . Feeling of Stress : Not on file  Social Connections:   . Frequency of Communication with Friends and Family: Not on file  . Frequency of Social Gatherings with Friends and Family: Not on file  . Attends Religious Services: Not on file  . Active Member of Clubs or Organizations: Not on file  . Attends Programme researcher, broadcasting/film/video Meetings: Not on file  . Marital Status: Not on file    Family History: Family History  Problem Relation Age of Onset  . Hypertension Mother     Allergies:  No Known Allergies  Medications Prior to Admission  Medication Sig Dispense Refill Last Dose  . ascorbic acid (VITAMIN C) 500 MG tablet Take 1 tablet (500 mg total) by mouth 2 (two) times daily. Take with iron tablet (Patient not taking: Reported on 09/09/2019) 60 tablet 3   . ferrous sulfate (FERROUSUL) 325 (65 FE) MG tablet Take 1 tablet (325 mg total) by mouth 2 (two) times daily with a meal. Take with Vitamin C tablet 60 tablet 3   . metroNIDAZOLE (FLAGYL) 500 MG tablet Take 1 tablet (500 mg total) by mouth 2 (two) times daily. (Patient not taking: Reported on 07/08/2019) 14 tablet 0   . miconazole (MICONAZOLE 7) 2 % vaginal cream Place 1 Applicatorful vaginally at bedtime. 45 g 0   . Prenatal-DSS-FeCb-FeGl-FA (CITRANATAL BLOOM) 90-1 MG TABS Take 1 tablet by mouth daily. 30 tablet 0      Review of Systems   All systems reviewed and negative except as stated in HPI  Last menstrual  period 01/12/2019. General appearance: alert, cooperative and appears stated age Lungs: clear to auscultation bilaterally Heart: regular rate and rhythm Abdomen: soft, non-tender; bowel sounds normal Pelvic: 1/50/-2, cephalic  Extremities: Homans sign is negative, no sign of DVT Presentation: cephalic Fetal monitoring: baseline 145, mod variability, pos accels, neg decels  Uterine activity: irregular     Prenatal labs: ABO, Rh: O/Positive/-- (04/27 1013) Antibody: Negative (04/27 1013) Rubella: 2.03 (04/27 1013) RPR: Non Reactive (07/29 0818)  HBsAg: Negative (04/27 1013)  HIV: Non Reactive (07/29 0818)  GBS: Negative/-- (09/30 1428)  2 hr Glucola normal Genetic screening  Low risk NIPs, quad screen increased risk for Down Syndrome Anatomy US normal   Prenatal Transfer Tool  Maternal Diabetes: No Genetic Screening: Abnormal:  Results: Elevated risk of Trisomy 21 Maternal Ultrasounds/Referrals: Normal Fetal Ultrasounds or other Referrals:  None Maternal Substance Abuse:  No Significant Maternal Medications:  None Significant Maternal Lab Results: Group B Strep negative  No results found for this or any previous visit (from the past 24 hour(s)).  Patient Active Problem List   Diagnosis Date Noted  . Chlamydia trachomatis infection in pregnancy in third trimester 10/15/2019  . Supervision of normal first pregnancy, antepartum 05/12/2019    Assessment/Plan:  Alison Martin is a 24 y.o. G1P0 at [redacted]w[redacted]d here for IOL for non-reactive NST at [redacted]wks GA.   #Induction of Labor: Cytotec and FB placed at 2230, pt tolerated well. Reassess in 4 hrs.   #Pain: Pain meds, epidural prn  #FWB: Cat I  #ID:  gbs negative   #chlamydia infection: multiple times in pregnancy. Treated 10/14. Chlamydia swab pending. Denies intercourse since treatment. Denies IPV.   #Spanish speaking: in person interpreter used for intake   #MOF: breast #MOC:desires nexplanon #Circ:  N/a   Gita Kudo, MD  11/11/2019, 9:30 PM

## 2019-11-12 ENCOUNTER — Inpatient Hospital Stay (HOSPITAL_COMMUNITY): Payer: Medicaid Other | Admitting: Anesthesiology

## 2019-11-12 ENCOUNTER — Encounter (HOSPITAL_COMMUNITY): Payer: Self-pay | Admitting: Family Medicine

## 2019-11-12 DIAGNOSIS — Z3A40 40 weeks gestation of pregnancy: Secondary | ICD-10-CM

## 2019-11-12 LAB — RESPIRATORY PANEL BY RT PCR (FLU A&B, COVID)
Influenza A by PCR: NEGATIVE
Influenza B by PCR: NEGATIVE
SARS Coronavirus 2 by RT PCR: NEGATIVE

## 2019-11-12 LAB — CERVICOVAGINAL ANCILLARY ONLY
Chlamydia: NEGATIVE
Comment: NEGATIVE
Comment: NEGATIVE
Comment: NORMAL
Neisseria Gonorrhea: NEGATIVE
Trichomonas: NEGATIVE

## 2019-11-12 LAB — RPR: RPR Ser Ql: NONREACTIVE

## 2019-11-12 MED ORDER — FENTANYL-BUPIVACAINE-NACL 0.5-0.125-0.9 MG/250ML-% EP SOLN
12.0000 mL/h | EPIDURAL | Status: DC | PRN
  Filled 2019-11-12: qty 250

## 2019-11-12 MED ORDER — LIDOCAINE HCL (PF) 1 % IJ SOLN
INTRAMUSCULAR | Status: DC | PRN
  Administered 2019-11-12: 10 mL via EPIDURAL

## 2019-11-12 MED ORDER — TERBUTALINE SULFATE 1 MG/ML IJ SOLN: 0.2500 mg | Freq: Once | INTRAMUSCULAR | Status: DC | PRN

## 2019-11-12 MED ORDER — EPHEDRINE 5 MG/ML INJ: 10.0000 mg | INTRAVENOUS | Status: DC | PRN

## 2019-11-12 MED ORDER — BENZOCAINE-MENTHOL 20-0.5 % EX AERO: 1.0000 "application " | INHALATION_SPRAY | CUTANEOUS | Status: DC | PRN

## 2019-11-12 MED ORDER — TETANUS-DIPHTH-ACELL PERTUSSIS 5-2.5-18.5 LF-MCG/0.5 IM SUSY
0.5000 mL | PREFILLED_SYRINGE | Freq: Once | INTRAMUSCULAR | Status: AC
  Administered 2019-11-13: 0.5 mL via INTRAMUSCULAR
  Filled 2019-11-12: qty 0.5

## 2019-11-12 MED ORDER — ACETAMINOPHEN 325 MG PO TABS
650.0000 mg | ORAL_TABLET | Freq: Four times a day (QID) | ORAL | Status: DC
  Administered 2019-11-13 – 2019-11-14 (×5): 650 mg via ORAL
  Filled 2019-11-12 (×6): qty 2

## 2019-11-12 MED ORDER — PHENYLEPHRINE 40 MCG/ML (10ML) SYRINGE FOR IV PUSH (FOR BLOOD PRESSURE SUPPORT): 80.0000 ug | PREFILLED_SYRINGE | INTRAVENOUS | Status: DC | PRN

## 2019-11-12 MED ORDER — DIPHENHYDRAMINE HCL 25 MG PO CAPS: 25.0000 mg | ORAL_CAPSULE | Freq: Four times a day (QID) | ORAL | Status: DC | PRN

## 2019-11-12 MED ORDER — LACTATED RINGERS IV SOLN
500.0000 mL | Freq: Once | INTRAVENOUS | Status: AC
  Administered 2019-11-12: 500 mL via INTRAVENOUS

## 2019-11-12 MED ORDER — BUPIVACAINE HCL (PF) 0.75 % IJ SOLN
INTRAMUSCULAR | Status: DC | PRN
Start: 2019-11-12 — End: 2019-11-12
  Administered 2019-11-12: 12 mL/h via EPIDURAL

## 2019-11-12 MED ORDER — OXYTOCIN-SODIUM CHLORIDE 30-0.9 UT/500ML-% IV SOLN
1.0000 m[IU]/min | INTRAVENOUS | Status: DC
  Administered 2019-11-12: 2 m[IU]/min via INTRAVENOUS
  Filled 2019-11-12: qty 500

## 2019-11-12 MED ORDER — DIPHENHYDRAMINE HCL 50 MG/ML IJ SOLN: 12.5000 mg | INTRAMUSCULAR | Status: DC | PRN

## 2019-11-12 MED ORDER — ACETAMINOPHEN 500 MG PO TABS
1000.0000 mg | ORAL_TABLET | Freq: Once | ORAL | Status: AC
  Administered 2019-11-12: 1000 mg via ORAL
  Filled 2019-11-12: qty 2

## 2019-11-12 MED ORDER — FENTANYL CITRATE (PF) 100 MCG/2ML IJ SOLN
INTRAMUSCULAR | Status: AC
  Filled 2019-11-12: qty 2

## 2019-11-12 MED ORDER — SENNOSIDES-DOCUSATE SODIUM 8.6-50 MG PO TABS
2.0000 | ORAL_TABLET | ORAL | Status: DC
  Administered 2019-11-13 (×2): 2 via ORAL
  Filled 2019-11-12 (×2): qty 2

## 2019-11-12 MED ORDER — PHENYLEPHRINE 40 MCG/ML (10ML) SYRINGE FOR IV PUSH (FOR BLOOD PRESSURE SUPPORT)
80.0000 ug | PREFILLED_SYRINGE | INTRAVENOUS | Status: DC | PRN
  Filled 2019-11-12: qty 10

## 2019-11-12 MED ORDER — PRENATAL MULTIVITAMIN CH
1.0000 | ORAL_TABLET | Freq: Every day | ORAL | Status: DC
  Administered 2019-11-13: 1 via ORAL
  Filled 2019-11-12: qty 1

## 2019-11-12 MED ORDER — WITCH HAZEL-GLYCERIN EX PADS: 1.0000 "application " | MEDICATED_PAD | CUTANEOUS | Status: DC | PRN

## 2019-11-12 MED ORDER — IBUPROFEN 600 MG PO TABS
600.0000 mg | ORAL_TABLET | Freq: Four times a day (QID) | ORAL | Status: DC
  Administered 2019-11-13 – 2019-11-14 (×5): 600 mg via ORAL
  Filled 2019-11-12 (×6): qty 1

## 2019-11-12 MED ORDER — ONDANSETRON HCL 4 MG/2ML IJ SOLN: 4.0000 mg | INTRAMUSCULAR | Status: DC | PRN

## 2019-11-12 MED ORDER — ONDANSETRON HCL 4 MG PO TABS: 4.0000 mg | ORAL_TABLET | ORAL | Status: DC | PRN

## 2019-11-12 MED ORDER — DIBUCAINE (PERIANAL) 1 % EX OINT: 1.0000 "application " | TOPICAL_OINTMENT | CUTANEOUS | Status: DC | PRN

## 2019-11-12 MED ORDER — FENTANYL CITRATE (PF) 100 MCG/2ML IJ SOLN
100.0000 ug | INTRAMUSCULAR | Status: DC | PRN
  Administered 2019-11-12: 100 ug via INTRAVENOUS

## 2019-11-12 MED ORDER — SIMETHICONE 80 MG PO CHEW: 80.0000 mg | CHEWABLE_TABLET | ORAL | Status: DC | PRN

## 2019-11-12 MED ORDER — COCONUT OIL OIL: 1.0000 "application " | TOPICAL_OIL | Status: DC | PRN

## 2019-11-12 NOTE — Progress Notes (Signed)
Spanish interpreter [phone] used for epidural; in house interpreter unavailable

## 2019-11-12 NOTE — Progress Notes (Signed)
Labor Progress Note  Subjective:  Doing well. No complaints. Feeling better with epidural   Objective:  BP 120/78    Pulse (!) 110    Temp 98.7 F (37.1 C) (Oral)    Resp 19    LMP 01/12/2019  Gen: Lying in bed comfortably. NAD. No signs of DVT.   Cervical: 7/70/-2, vertex  Toco: regular q21minutes FH: BL 135, moderate var, + a, none decels.   Assessment and Plan:  Alison Martin is a 24 y.o. G1P0 at [redacted]w[redacted]d admitted for IOL NR NST.   Labor:  S/p FB, cytotec. Pit started at 0300, AROM 0630 clear. Progressing well, on 6 mu/min pitocin currently.   Pain control: Epidural  Anticipated MOD: NSVD  Fetal Wellbeing: Category I  GBS Negative/-- (09/30 1428)   Continuous fetal monitoring  Language Merchandiser, retail on tablet  Genia Hotter, M.D.  FM PGY 3 11/12/2019 11:52 AM

## 2019-11-12 NOTE — Progress Notes (Signed)
Labor Progress Note Alison Martin is a 24 y.o. G1P0 at [redacted]w[redacted]d presented for IOL for NR NST.   S: Contractions more intense   O:  BP 116/69   Pulse 87   Temp 98.3 F (36.8 C) (Oral)   Resp 16   LMP 01/12/2019  EFM: baseline 130 bpm / moderate variability / accels present, no decels  CVE: Dilation: 6 Effacement (%): 70 Station: -2, -1 Presentation: Vertex Exam by:: Casper Harrison, MD AROM scant clear fluid   A&P: 24 y.o. G1P0 [redacted]w[redacted]d presented for IOL for NR NST.  #Labor: Progressing well. S/p FB and cytotec, FB out at 0200 and pitocin started, currently at 4. AROM clear fluid.   #Pain: IV fentanyl, epidural prn  #FWB: Cat 1 #GBS negative   #chlamydia infection: multiple times in pregnancy. Treated 10/14. Chlamydia swab pending. Denies intercourse since treatment. Denies IPV   #Spanish speaking: interpreter used  Gita Kudo, MD 7:12 AM

## 2019-11-12 NOTE — Lactation Note (Signed)
This note was copied from a baby's chart. Lactation Consultation Note  Patient Name: Alison Martin [id number] Date: 11/12/2019 Reason for consult: Initial assessment  Initial visit with 2 hours old infant. Mother is a primipara, first-time breastfeeding. RN requested assistance to latch infant.  Infant is skin to skin upon arrival. Talked to mother about hand expression and demonstrated technique, colostrum is easily expressed. Set up support pillows for football position to left breast. infant unable to latch after several attempts. Positioned infant for modified cradle to right breast. Infant latch with ease.  Transitioned to football position right breast. Infant able to breastfeed for ~12 minutes. Noted suckling and swallowing. Mother denies discomfort or pain. Infant unlatched but still cueing. Hand expressed for mother, collected ~61mL and taught spoon-feeding. Provided manual pump to assist with nipple eversion, demonstrated how to use.  Reviewed with mother average size of a NB stomach. Encourage to follow babies' hunger and fullness cues. Reviewed importance to offer the breast 8 to 12 times in a 24-hour period for proper stimulation and to establish good milk supply. Reviewed colostrum benefits for baby. Reviewed breastfeeding basics. Reviewed newborn behavior and expectations with parents during first 24HOL. Promoted maternal rest, hydration and food intake. Encouraged to contact [facility] for support when ready to breastfeed baby and recommended to request help for questions or concerns.    All questions answered at this time.    Maternal Data Formula Feeding for Exclusion: No Has patient been taught Hand Expression?: Yes Does the patient have breastfeeding experience prior to this delivery?: No  Feeding Feeding Type: Breast Fed  LATCH Score Latch: Repeated attempts needed to sustain latch, nipple held in mouth throughout feeding, stimulation needed to elicit sucking  reflex.  Audible Swallowing: A few with stimulation  Type of Nipple: Everted at rest and after stimulation (short shaft)  Comfort (Breast/Nipple): Soft / non-tender  Hold (Positioning): Assistance needed to correctly position infant at breast and maintain latch.  LATCH Score: 7  Interventions Interventions: Breast feeding basics reviewed;Assisted with latch;Skin to skin;Breast massage;Hand express;Adjust position;Hand pump;Expressed milk;Position options;Support pillows  Lactation Tools Discussed/Used WIC Program: Yes Pump Review: Setup, frequency, and cleaning Initiated by:: Alison Martin IBCLC Date initiated:: 11/12/19   Consult Status Consult Status: Follow-up Date: 11/13/19 Follow-up type: In-patient    Alison Martin 11/12/2019, 8:02 PM

## 2019-11-12 NOTE — Anesthesia Preprocedure Evaluation (Signed)
Anesthesia Evaluation  Patient identified by MRN, date of birth, ID band Patient awake    Reviewed: Allergy & Precautions, H&P , NPO status , Patient's Chart, lab work & pertinent test results  History of Anesthesia Complications Negative for: history of anesthetic complications  Airway Mallampati: II  TM Distance: >3 FB Neck ROM: full    Dental no notable dental hx.    Pulmonary neg pulmonary ROS, former smoker,    Pulmonary exam normal        Cardiovascular negative cardio ROS Normal cardiovascular exam Rhythm:regular Rate:Normal     Neuro/Psych negative neurological ROS  negative psych ROS   GI/Hepatic negative GI ROS, Neg liver ROS,   Endo/Other  negative endocrine ROS  Renal/GU negative Renal ROS  negative genitourinary   Musculoskeletal   Abdominal   Peds  Hematology negative hematology ROS (+)   Anesthesia Other Findings   Reproductive/Obstetrics (+) Pregnancy                             Anesthesia Physical Anesthesia Plan  ASA: II  Anesthesia Plan: Epidural   Post-op Pain Management:    Induction:   PONV Risk Score and Plan:   Airway Management Planned:   Additional Equipment:   Intra-op Plan:   Post-operative Plan:   Informed Consent: I have reviewed the patients History and Physical, chart, labs and discussed the procedure including the risks, benefits and alternatives for the proposed anesthesia with the patient or authorized representative who has indicated his/her understanding and acceptance.       Plan Discussed with:   Anesthesia Plan Comments:         Anesthesia Quick Evaluation  

## 2019-11-12 NOTE — Discharge Summary (Addendum)
Postpartum Discharge Summary      Patient Name: Alison Martin DOB: 29-Dec-1995 MRN: [id number]  Date of admission: 11/11/2019 Delivery date:11/12/2019  Delivering provider: Zettie Cooley E  Date of discharge: 11/14/2019  Admitting diagnosis: Post-dates pregnancy [O48.0] Intrauterine pregnancy: [redacted]w[redacted]d    Secondary diagnosis:  Principal Problem:   Vaginal delivery Active Problems:   Chlamydia trachomatis infection in pregnancy in third trimester   Post-dates pregnancy  Additional problems: as noted above   Discharge diagnosis: Vaginal delivery                                            Post partum procedures:none Augmentation: AROM, Pitocin, Cytotec and IP Foley Complications: None  Hospital course: Induction of Labor With Vaginal Delivery   24y.o. yo G1P1001 at 464w1das admitted to the hospital 11/11/2019 for induction of labor.  Indication for induction: non-reactive NST.  Patient had an uncomplicated labor course as follows: Membrane Rupture Time/Date: 7:05 AM ,11/12/2019   Delivery Method:Vaginal, Spontaneous  Episiotomy: None  Lacerations:  2nd degree  Details of delivery can be found in separate delivery note.  Patient had a routine postpartum course. Patient is discharged home 11/14/19.  Newborn Data: Birth date:11/12/2019  Birth time:5:47 PM  Gender:Female  Living status:Living  Apgars:7 ,9  Weight:4425 g   Magnesium Sulfate received: No BMZ received: No Rhophylac:N/A MMR:N/A T-DaP: Given postnatally Flu: Yes Transfusion:No  Physical exam  Vitals:   11/13/19 0600 11/13/19 1427 11/13/19 2010 11/14/19 0457  BP: 111/71 103/68 117/73 103/69  Pulse: (!) 107 99 (!) 101 99  Resp: _0 Temp: 98.2 F (36.8 C) 98 F (36.7 C) 98.3 F (36.8 C) 98.1 F (36.7 C)  TempSrc: Axillary Oral Oral Axillary  SpO2: 100% 99% 100% 100%   General: alert, cooperative and no distress Lochia: appropriate Uterine Fundus: firm Incision: N/A DVT Evaluation: No  evidence of DVT seen on physical exam. No cords or calf tenderness. No significant calf/ankle edema. Labs: Lab Results  Component Value Date   WBC 12.6 (H) 11/11/2019   HGB 11.9 (L) 11/11/2019   HCT 36.9 11/11/2019   MCV 79.0 (L) 11/11/2019   PLT 297 11/11/2019   No flowsheet data found. Edinburgh Score: Edinburgh Postnatal Depression Scale Screening Tool 11/13/2019  I have been able to laugh and see the funny side of things. 1  I have looked forward with enjoyment to things. 1  I have blamed myself unnecessarily when things went wrong. 0  I have been anxious or worried for no good reason. 1  I have felt scared or panicky for no good reason. 1  Things have been getting on top of me. 0  I have been so unhappy that I have had difficulty sleeping. 0  I have felt sad or miserable. 1  I have been so unhappy that I have been crying. 1  The thought of harming myself has occurred to me. 0  Edinburgh Postnatal Depression Scale Total 6     After visit meds:  Allergies as of 11/14/2019   No Known Allergies     Medication List    STOP taking these medications   ascorbic acid 500 MG tablet Commonly known as: VITAMIN C   CitraNatal Bloom 90-1 MG Tabs   ferrous sulfate 325 (65 FE) MG tablet Commonly known as: FerrouSul   metroNIDAZOLE 500  MG tablet Commonly known as: FLAGYL   miconazole 2 % vaginal cream Commonly known as: Miconazole 7     TAKE these medications   acetaminophen 325 MG tablet Commonly known as: Tylenol Take 2 tablets (650 mg total) by mouth every 6 (six) hours.   coconut oil Oil Apply 1 application topically as needed.   dibucaine 1 % Oint Commonly known as: NUPERCAINAL Place 1 application rectally as needed for hemorrhoids.   ibuprofen 600 MG tablet Commonly known as: ADVIL Take 1 tablet (600 mg total) by mouth every 6 (six) hours.   prenatal multivitamin Tabs tablet Take 1 tablet by mouth daily at 12 noon.        Discharge home in stable  condition Infant Feeding: Breast Infant Disposition:home with mother Discharge instruction: per After Visit Summary and Postpartum booklet. Activity: Advance as tolerated. Pelvic rest for 6 weeks.  Diet: routine diet Future Appointments: Future Appointments  Date Time Provider [place]  12/24/2019  1:10 PM Alison Martin, CNM [facility] None   Follow up Visit:  Follow-up Information    [place]. Go in 4 week(s).   Specialty: Obstetrics and Gynecology Why: Go to your [facility] provider for a 4-week follow up appointment.  Vaya a su proveedor de obstetricia para una cita de seguimiento de 4 semanas. Contact information: [place] [address] [place] [facility] [address]. Go to.   Specialty: Obstetrics and Gynecology Why: Return to the MAU if you develop fever over 100.4*F, worsening pain or bleeding, or signs of depression.   Regrese a la MAU si presenta fiebre de ms de 100.4 * F, empeoramiento del dolor o sangrado, o signos de depresin. Contact information: [address] [id number] [place] [phone]             Message sent to [facility] on 11/12/19.  Please schedule this patient for a In person postpartum visit in 4 weeks with the following provider: Any provider. Additional Postpartum F/U:none  Low risk pregnancy complicated by: chlamydia in 3rd trimester with negative TOC Delivery mode:  Vaginal, Spontaneous  Anticipated Birth Control:  plan for outpatient Nexplanon   11/14/2019 Alison Essex, MD   Midwife attestation I have seen and examined this patient and agree with above documentation in the resident's note.   Alison Martin is a 24 y.o. G1P1001 s/p SVD.   Pain is well controlled.  Plan for birth control is outpatient Nexplanon.  Method of Feeding: breast  PE:  BP 103/69 (BP Location: Right Arm)   Pulse 99   Temp 98.1  F (36.7 C) (Axillary)   Resp 16   LMP 01/12/2019   SpO2 100%   Breastfeeding Unknown  Gen: well appearing Heart: reg rate Lungs: normal WOB Fundus firm Ext: soft, no pain, no edema  Recent Labs    11/11/19 2217  HGB 11.9*  HCT 36.9     Plan: discharge today - postpartum care discussed - f/u clinic in 4-6 weeks for postpartum visit   Alison Martin, CNM 10:27 AM

## 2019-11-12 NOTE — Anesthesia Procedure Notes (Signed)
Epidural Patient location during procedure: OB Start time: 11/12/2019 10:27 AM End time: 11/12/2019 10:37 AM  Staffing Anesthesiologist: Lucretia Kern, MD Performed: anesthesiologist   Preanesthetic Checklist Completed: patient identified, IV checked, risks and benefits discussed, monitors and equipment checked, pre-op evaluation and timeout performed  Epidural Patient position: sitting Prep: DuraPrep Patient monitoring: heart rate, continuous pulse ox and blood pressure Approach: midline Location: L3-L4 Injection technique: LOR air  Needle:  Needle type: Tuohy  Needle gauge: 17 G Needle length: 9 cm Needle insertion depth: 7 cm Catheter type: closed end flexible Catheter size: 19 Gauge Catheter at skin depth: 12 cm Test dose: negative  Assessment Events: blood not aspirated, injection not painful, no injection resistance, no paresthesia and negative IV test  Additional Notes Reason for block:procedure for pain

## 2019-11-12 NOTE — Progress Notes (Signed)
Spanish interpreter [phone] used for physical assessment and introduction

## 2019-11-12 NOTE — Progress Notes (Signed)
Labor Progress Note Alison Martin is a 24 y.o. G1P0 at [redacted]w[redacted]d presented for IOL for NR NST.   S: Doing well, no complaints. Pain is "so-so". Remote interpreter services used in room.   O:  BP 133/72   Pulse 81   Temp 98.1 F (36.7 C) (Oral)   Resp 17   LMP 01/12/2019  EFM: baseline 130 bpm / moderate variability / accels present, no decels  CVE: Dilation: 1 Presentation: Vertex Exam by:: Casper Harrison, MD   A&P: 24 y.o. G1P0 [redacted]w[redacted]d presented for IOL for NR NST.  #Labor: Progressing well. Next due for cervical check at 0230. Given reassuring fetal tracing, will likely give another dose cytotec.  #Pain: IV fentanyl prn #FWB: Cat 1 #GBS negative --------------------------- #chlamydia infection: multiple times in pregnancy. Treated 10/14. Chlamydia swab pending. Denies intercourse since treatment. Denies IPV.   #Spanish speaking: in person interpreter used for intake   [place] Pho, MD 1:50 AM

## 2019-11-13 MED ORDER — INFLUENZA VAC SPLIT QUAD 0.5 ML IM SUSY
0.5000 mL | PREFILLED_SYRINGE | INTRAMUSCULAR | Status: AC
  Administered 2019-11-13: 0.5 mL via INTRAMUSCULAR
  Filled 2019-11-13: qty 0.5

## 2019-11-13 MED ORDER — LIDOCAINE HCL 1 % IJ SOLN: 0.0000 mL | Freq: Once | INTRAMUSCULAR | Status: DC | PRN

## 2019-11-13 MED ORDER — ETONOGESTREL 68 MG ~~LOC~~ IMPL: 68.0000 mg | DRUG_IMPLANT | Freq: Once | SUBCUTANEOUS | Status: DC

## 2019-11-13 NOTE — Progress Notes (Addendum)
POSTPARTUM PROGRESS NOTE  Subjective: Alison Martin is a 24 y.o. G1P1001 PPD#1 s/p SVD (1747) at 104w1d.  She reports she doing well. No acute events overnight. She denies any problems with ambulating, voiding or po intake. Denies nausea or vomiting. She has not passed flatus. Pain is well controlled.  Lochia is decreasing and without large clot.  Objective: Blood pressure 117/78, pulse 100, temperature 98.3 F (36.8 C), temperature source Oral, resp. rate 16, last menstrual period 01/12/2019, SpO2 100 %, unknown if currently breastfeeding.  Physical Exam:  General: alert, cooperative and no distress Chest: no respiratory distress Abdomen: soft, non-tender  Uterine Fundus: firm and at level of umbilicus Extremities: No calf swelling or tenderness  no edema  Recent Labs    11/11/19 2217  HGB 11.9*  HCT 36.9    Assessment/Plan: Alison Martin is a 24 y.o. G1P1001 currently PPD#1 s/p SVD (1747) at [redacted]w[redacted]d.  Routine Postpartum Care: Doing well, pain well-controlled.  -- Continue routine care, lactation support  -- Contraception: Nexplanon, consented at bedside  -- Feeding: breast, requests lactation consultant to come by today  Dispo: Plan for discharge tomorrow on PPD#2.  Fayette Pho, MD 11/13/2019 6:50 AM   I saw and evaluated the patient. I agree with the findings and the plan of care as documented in the resident's note.  Casper Harrison, MD [facility] Family [facility] Fellow, [facility] for [facility], [facility]

## 2019-11-13 NOTE — Progress Notes (Signed)
Interpreter "Daphine Deutscher" [phone], used for education and pain assessment.  Discussed infant's fractured clavicle during this phone call also.  Pediatrician to follow up with parents regarding the fractured clavicle and their questions. Nyrie Sigal D

## 2019-11-13 NOTE — Progress Notes (Signed)
Breakfast order placed with help of video interpreter Luther Parody [phone]; set to arrive at 0730.   Elvia Collum, RN 11/13/19

## 2019-11-13 NOTE — Anesthesia Postprocedure Evaluation (Signed)
Anesthesia Post Note  Patient: Alison Martin  Procedure(s) Performed: AN AD HOC LABOR EPIDURAL     Patient location during evaluation: Mother Baby Anesthesia Type: Epidural Level of consciousness: awake Pain management: satisfactory to patient Vital Signs Assessment: post-procedure vital signs reviewed and stable Respiratory status: spontaneous breathing Cardiovascular status: stable Anesthetic complications: no   No complications documented.  Last Vitals:  Vitals:   11/12/19 2210 11/13/19 0200  BP: 99/74 117/78  Pulse: (!) 103 100  Resp: 18 16  Temp: 36.9 C 36.8 C  SpO2: 100% 100%    Last Pain:  Vitals:   11/13/19 0401  TempSrc:   PainSc: 0-No pain   Pain Goal:                 Report via Efraim Kaufmann Mabe RN  Cephus Shelling

## 2019-11-14 DIAGNOSIS — Z8759 Personal history of other complications of pregnancy, childbirth and the puerperium: Secondary | ICD-10-CM

## 2019-11-14 MED ORDER — DIBUCAINE (PERIANAL) 1 % EX OINT: 1.0000 "application " | TOPICAL_OINTMENT | CUTANEOUS | Status: DC | PRN

## 2019-11-14 MED ORDER — ACETAMINOPHEN 325 MG PO TABS
650.0000 mg | ORAL_TABLET | Freq: Four times a day (QID) | ORAL | Status: DC
Start: 2019-11-14 — End: 2021-06-09

## 2019-11-14 MED ORDER — PRENATAL MULTIVITAMIN CH
1.0000 | ORAL_TABLET | Freq: Every day | ORAL | Status: DC
Start: 2019-11-14 — End: 2021-03-15

## 2019-11-14 MED ORDER — IBUPROFEN 600 MG PO TABS
600.0000 mg | ORAL_TABLET | Freq: Four times a day (QID) | ORAL | 0 refills | Status: DC
Start: 2019-11-14 — End: 2021-06-09

## 2019-11-14 MED ORDER — COCONUT OIL OIL
1.0000 | TOPICAL_OIL | 0 refills | Status: DC | PRN
Start: 2019-11-14 — End: 2021-06-09

## 2019-11-14 NOTE — Lactation Note (Addendum)
This note was copied from a baby's chart. Lactation Consultation Note  Patient Name: Alison Martin [id number] Date: 11/14/2019 Reason for consult: Follow-up assessment;Mother's request;Difficult latch;Primapara;1st time breastfeeding;Term;Infant weight loss;Nipple pain/trauma  Infant is 42 hours old 40 weeks with Right clavicle fracture and 7% weight loss. Last bilirubin 9.6 mg/dl 35 high intermediate risk. LC reached to RN, Alison Martin, to see if a serum bilirubin is scheduled. RN stated infant being monitored for temp for 48 hrs and she will check back with the provider after they round.   Infant started on the Nipple shield for feedings. With the assistance if interpreter, Alison Martin, Mom stated she d/c the nipple shield and is latching at the breast. She states she still has some pain with the latch. LC examined both nipples which are red with a compression stripe on the left but no signs of skin breakdown. LC gave Mom comfort gels with instructions on how to use it and discard after 6 days. Instructions given to also apply coconut oil for nipple care but not to use at the same time as the coconut oil.    LC reviewed with Mom how to get a deeper latch and adjust by tugging on the chin and making sure the lips are flanged. If she continues to have nipple pain, LC brochure reviewed with parents to make an outpatient appointment.  Mom states she has fed STS and is aware to look for signs of milk transfer with swallowing of the lower jaw. Infant had 6 urine and 6 stool since birth.   Mom is nursing every 3 hours for 20 to 30 minutes. LC educated Mom how to look for cues and based feedings on that and not to wait more than 4 hours without an attempt. Infant last fed 40 minutes before LC visit, so I was unable to see a latch as infant was sleeping.   LC reviewed with Mom to supplement with every feed using the [employer] based on the breastfeeding supplemenation guide and hours after  birth reviewed with parents. To date, Mom has only given formula once at 4 am using slow flow nipple of 20 ml.   Plan 1. To feed based on cues 8-12x in 24 hour period no more than 4 hours without an attempt. Mom to Martin both breasts looking for signs of milk transfer.          2. Alison Martin to paced bottle feed with Alison Martin or EBM  7-12 ml after nursing.          3. Mom to pump with manual q 3 hours for 10 minutes each breast to increase milk supply.          4. I's and O's sheet and outpatient LC services reviewed with parents.          5. Signs, symptoms and treatment of engorgement reviewed with parents.           6. WIC referral completed and faxed to [facility].

## 2019-11-14 NOTE — Lactation Note (Addendum)
This note was copied from a baby's chart. Lactation Consultation Note  Patient Name: Alison Martin [id number] Date: 11/14/2019 Reason for consult: Follow-up assessment  Follow up with 31 hours old infant of P1 mother. Infant is breastfeeding upon arrival noted shallow latch. Infant unlatched nipple is slanted. Encouraged mother to try a deep latch but infant repositions.  Infant is crying and pops on and off breast. Infant has a wet diaper and calmed down after changed. Infant able to latch successfully after a couple of attempts using NS to left breast, football position. Noted suckling and swallowing. Mother massaged and compressed breast. HE, collected ~4mL of EBM and spoonfed. Relatched infant using NS to left breast.   Talked about using colostrum to heal nipples and provided comfort gels to help with healing. Promoted maternal rest, hydration and food intake. Encouraged to contact [facility] for support when ready to breastfeed baby and recommended to request help for questions or concerns.    All questions answered at this time.   Consult was done in Spanish  Maternal Data Formula Feeding for Exclusion: No Has patient been taught Hand Expression?: Yes  Feeding Feeding Type: Breast Fed  LATCH Score Latch: Repeated attempts needed to sustain latch, nipple held in mouth throughout feeding, stimulation needed to elicit sucking reflex.  Audible Swallowing: Spontaneous and intermittent  Type of Nipple: Everted at rest and after stimulation (short)  Comfort (Breast/Nipple): Filling, red/small blisters or bruises, mild/mod discomfort  Hold (Positioning): Assistance needed to correctly position infant at breast and maintain latch.  LATCH Score: 7  Interventions Interventions: Breast feeding basics reviewed;Assisted with latch;Skin to skin;Breast massage;Hand express;Adjust position;Support pillows;Position options;Expressed milk  Lactation Tools Discussed/Used     Consult  Status Consult Status: Follow-up Date: 11/14/19 Follow-up type: In-patient    Malillany Kazlauskas A Higuera Ancidey 11/14/2019, 1:03 AM

## 2019-11-14 NOTE — Discharge Instructions (Signed)
Parto vaginal, cuidados de puerperio Postpartum Care After Vaginal Delivery Lea esta informacin sobre cmo cuidarse desde el momento en que nazca su beb y hasta 6 a 12 semanas despus del parto (perodo del posparto). El mdico tambin podr darle instrucciones ms especficas. Comunquese con su mdico si tiene problemas o preguntas. Siga estas indicaciones en su casa: Hemorragia vaginal  Es normal tener un poco de hemorragia vaginal (loquios) despus del parto. Use un apsito sanitario para el sangrado vaginal y secrecin. ? Durante la primera semana despus del parto, la cantidad y el aspecto de los loquios a menudo es similar a las del perodo menstrual. ? Durante las siguientes semanas disminuir gradualmente hasta convertirse en una secrecin seca amarronada o amarillenta. ? En la mayora de las mujeres, los loquios se detienen completamente entre 4 a 6semanas despus del parto. Los sangrados vaginales pueden variar de mujer a mujer.  Cambie los apsitos sanitarios con frecuencia. Observe si hay cambios en el flujo, como: ? Un aumento repentino en el volumen. ? Cambio en el color. ? Cogulos sanguneos grandes.  Si expulsa un cogulo de sangre por la vagina, gurdelo y llame al mdico para informrselo. No deseche los cogulos de sangre por el inodoro antes de hablar con su mdico.  No use tampones ni se haga duchas vaginales hasta que el mdico la autorice.  Si no est amamantando, volver a tener su perodo entre 6 y 8 semanas despus del parto. Si solamente alimenta al beb con leche materna (lactancia materna exclusiva), podra no volver a tener su perodo hasta que deje de amamantar. Cuidados perineales  Mantenga la zona entre la vagina y el ano (perineo) limpia y seca, como se lo haya indicado el mdico. Utilice apsitos o aerosoles analgsicos y cremas, como se lo hayan indicado.  Si le hicieron un corte en el perineo (episiotoma) o tuvo un desgarro en la vagina, controle la  zona para detectar signos de infeccin hasta que sane. Est atenta a los siguientes signos: ? Aumento del enrojecimiento, la hinchazn o el dolor. ? Presenta lquido o sangre que supura del corte o desgarro. ? Calor. ? Pus o mal olor.  Es posible que le den una botella rociadora para que use en lugar de limpiarse el rea con papel higinico despus de usar el bao. Cuando comience a sanar, podr usar la botella rociadora antes de secarse. Asegrese de secarse suavemente.  Para aliviar el dolor causado por una episiotoma, un desgarro en la vagina o venas hinchadas en el ano (hemorroides), trate de tomar un bao de asiento tibio 2 o 3 veces por da. Un bao de asiento es un bao de agua tibia que se toma mientras se est sentado. El agua solo debe llegar hasta las caderas y cubrir las nalgas. Cuidado de las mamas  En los primeros das despus del parto, las mamas pueden sentirse pesadas, llenas e incmodas (congestin mamaria). Tambin puede escaparse leche de sus senos. El mdico puede sugerirle mtodos para aliviar este malestar. La congestin mamaria debera desaparecer al cabo de unos das.  Si est amamantando: ? Use un sostn que sujete y ajuste bien sus pechos. ? Mantenga los pezones secos y limpios. Aplquese cremas y ungentos, como se lo haya indicado el mdico. ? Es posible que deba usar discos de algodn en el sostn para absorber la leche que se filtre de sus senos. ? Puede tener contracciones uterinas cada vez que amamante durante varias semanas despus del parto. Las contracciones uterinas ayudan al tero a   regresar a su tamao habitual. ? Si tiene algn problema con la lactancia materna, colabore con el mdico o un asesor en lactancia.  Si no est amamantando: ? Evite tocarse mucho las mamas. Al hacerlo, podran producir ms leche. ? Use un sostn que le proporcione el ajuste correcto y compresas fras para reducir la hinchazn. ? No extraiga (saque) leche materna. Esto har que  produzca ms leche. Intimidad y sexualidad  Pregntele al mdico cundo puede retomar la actividad sexual. Esto puede depender de lo siguiente: ? Su riesgo de sufrir infecciones. ? La rapidez con la que est sanando. ? Su comodidad y deseo de retomar la actividad sexual.  Despus del parto, puede quedar embarazada incluso si no ha tenido todava su perodo. Si lo desea, hable con el mdico acerca de los mtodos de control de la natalidad (mtodos anticonceptivos). Medicamentos  Tome los medicamentos de venta libre y los recetados solamente como se lo haya indicado el mdico.  Si le recetaron un antibitico, tmelo como se lo haya indicado el mdico. No deje de tomar el antibitico aunque comience a sentirse mejor. Actividad  Retome sus actividades normales de a poco como se lo haya indicado el mdico. Pregntele al mdico qu actividades son seguras para usted.  Descanse todo lo que pueda. Trate de descansar o tomar una siesta mientras el beb duerme. Comida y bebida   Beba suficiente lquido como para mantener la orina de color amarillo plido.  Coma alimentos ricos en fibras todos los das. Estos pueden ayudarla a prevenir o aliviar el estreimiento. Los alimentos ricos en fibras incluyen, entre otros: ? Panes y cereales integrales. ? Arroz integral. ? Frijoles. ? Frutas y verduras frescas.  No intente perder de peso rpidamente reduciendo el consumo de caloras.  Tome sus vitaminas prenatales hasta la visita de seguimiento de posparto o hasta que su mdico le indique que puede dejar de tomarlas. Estilo de vida  No consuma ningn producto que contenga nicotina o tabaco, como cigarrillos y cigarrillos electrnicos. Si necesita ayuda para dejar de fumar, consulte al mdico.  No beba alcohol, especialmente si est amamantando. Instrucciones generales  Concurra a todas las visitas de seguimiento para usted y el beb, como se lo haya indicado el mdico. La mayora de las mujeres  visita al mdico para un seguimiento de posparto dentro de las primeras 3 a 6 semanas despus del parto. Comunquese con un mdico si:  Se siente incapaz de controlar los cambios que implica tener un hijo y esos sentimientos no desaparecen.  Siente tristeza o preocupacin de forma inusual.  Las mamas se ponen rojas, le duelen o se endurecen.  Tiene fiebre.  Tiene dificultad para retener la orina o para impedir que la orina se escape.  Tiene poco inters o falta de inters en actividades que solan gustarle.  No ha amamantado nada y no ha tenido un perodo menstrual durante 12 semanas despus del parto.  Dej de amamantar al beb y no ha tenido su perodo menstrual durante 12 semanas despus de dejar de amamantar.  Tiene preguntas sobre su cuidado y el del beb.  Elimina un cogulo de sangre grande por la vagina. Solicite ayuda de inmediato si:  Siente dolor en el pecho.  Tiene dificultad para respirar.  Tiene un dolor repentino e intenso en la pierna.  Tiene dolor intenso o clicos en el la parte inferior del abdomen.  Tiene una hemorragia tan intensa de la vagina que empapa ms de un apsito en una hora. El sangrado   no debe ser ms abundante que el perodo ms intenso que haya tenido.  Dolor de cabeza intenso.  Se desmaya.  Tiene visin borrosa o manchas en la vista.  Tiene secrecin vaginal con mal olor.  Tiene pensamientos acerca de lastimarse a usted misma o a su beb. Si alguna vez siente que puede lastimarse a usted misma o a otras personas, o tiene pensamientos de poner fin a su vida, busque ayuda de inmediato. Puede dirigirse al departamento de emergencias ms cercano o llamar a:  El servicio de emergencias de su localidad (911 en [place]  Una lnea de asistencia al suicida y atencin en crisis, como la Lnea Nacional de Prevencin del Suicidio (National Suicide Prevention Lifeline), al 1-800-273-8255. Est disponible las 24 horas del da. Resumen  El  perodo de tiempo justo despus el parto y hasta 6 a 12 semanas despus del parto se denomina perodo posparto.  Retome sus actividades normales de a poco como se lo haya indicado el mdico.  Concurra a todas las visitas de seguimiento para usted y el beb, como se lo haya indicado el mdico. Esta informacin no tiene como fin reemplazar el consejo del mdico. Asegrese de hacerle al mdico cualquier pregunta que tenga. Document Revised: 04/13/2017 Document Reviewed: 12/23/2016 Elsevier Patient Education  2020 Elsevier Inc.  

## 2019-11-15 ENCOUNTER — Ambulatory Visit: Payer: Self-pay

## 2019-11-15 NOTE — Lactation Note (Addendum)
This note was copied from a baby's chart. Lactation Consultation Note  Patient Name: Alison Martin Poster [id number] Date: 11/15/2019   Dawson Bills, in-house interpreter, was used for the following consult:  Mom is offering both breasts before giving formula. She is comfortable with her hand pump & used it twice yesterday & once overnight. Mom says the L breast is more difficult for infant to latch onto. Mom has not been using the nipple shield b/c the baby doesn't like it. However, her L nipple is atraumatic & her R nipple has a compression stripe.   Mom agrees that her breasts feel heavier today (infant was 13 hrs old at time of consult) and that she hears swallows when infant is at breast. Mom says that the stools are dark green in color.   Infant has only lost 26 g over the last 24 hrs.   Plan: 1. Offer breast & then formula. 2. Pump whenever infant receives formula so that Mom can start giving EBM.   How to wash pump parts was reviewed by Lactation Student. The hand-out from the CDC, "How to Keep Your Breast Pump Kit Clean," was provided to Mom in Spanish with an emphasis on not letting parts soak more than 5 minutes & not putting parts in the sink for cleaning, etc.    Mom was provided coconut oil & shells & shown how to use the shells (to prevent nipples from rubbing against fabric & to prevent coconut oil from rubbing off of nipples). Mom also has Comfort Gels; indications for use were reviewed & Mom knows not to wear Comfort Gels at the same time as when  wearing coconut oil.   Parents' questions answered to their satisfaction. Infant had recently fed & slept through consult (parents shared that infant did not sleep much last night). Thus, a latch assist/check was not done.    Matthias Hughs [facility] 11/15/2019, 8:18 AM

## 2019-11-16 ENCOUNTER — Other Ambulatory Visit (HOSPITAL_COMMUNITY): Payer: Self-pay

## 2019-11-18 ENCOUNTER — Inpatient Hospital Stay (HOSPITAL_COMMUNITY): Admission: AD | Admit: 2019-11-18 | Payer: Self-pay | Source: Home / Self Care | Admitting: Obstetrics & Gynecology

## 2019-11-18 ENCOUNTER — Inpatient Hospital Stay (HOSPITAL_COMMUNITY): Payer: Self-pay

## 2019-12-24 ENCOUNTER — Encounter: Payer: Self-pay | Admitting: Obstetrics and Gynecology

## 2019-12-24 ENCOUNTER — Ambulatory Visit (INDEPENDENT_AMBULATORY_CARE_PROVIDER_SITE_OTHER): Payer: Self-pay | Admitting: Obstetrics and Gynecology

## 2019-12-24 ENCOUNTER — Other Ambulatory Visit: Payer: Self-pay

## 2019-12-24 DIAGNOSIS — O99893 Other specified diseases and conditions complicating puerperium: Secondary | ICD-10-CM

## 2019-12-24 DIAGNOSIS — K5900 Constipation, unspecified: Secondary | ICD-10-CM

## 2019-12-24 MED ORDER — POLYETHYLENE GLYCOL 3350 17 G PO PACK: 17.0000 g | PACK | Freq: Every day | ORAL | 0 refills | Status: DC | PRN

## 2019-12-24 NOTE — Progress Notes (Signed)
Post Partum Visit Note  Alison Martin is a 24 y.o. G57P1001 female who presents for a postpartum visit. She is 6 weeks postpartum following a normal spontaneous vaginal delivery.  I have fully reviewed the prenatal and intrapartum course. The delivery was at 40.1 gestational weeks.  Anesthesia: epidural. Postpartum course has been uncomplicated. Baby is doing well. Baby is feeding by both breast and bottle - Gerber. Bleeding staining only. Bowel function is abnormal: constipation. Bladder function is normal. Patient is not sexually active. Contraception method is none, but she is considering an IUD. Postpartum depression screening: negative.   The pregnancy intention screening data noted above was reviewed. Potential methods of contraception were discussed. The patient elected to proceed with Female Condom. She will get scheduled with [facility] to have IUD inserted.  Edinburgh Postnatal Depression Scale - 12/24/19 1320      Edinburgh Postnatal Depression Scale:  In the Past 7 Days   I have been able to laugh and see the funny side of things. 0    I have looked forward with enjoyment to things. 0    I have blamed myself unnecessarily when things went wrong. 0    I have been anxious or worried for no good reason. 1    I have felt scared or panicky for no good reason. 0    Things have been getting on top of me. 0    I have been so unhappy that I have had difficulty sleeping. 0    I have felt sad or miserable. 0    I have been so unhappy that I have been crying. 0    The thought of harming myself has occurred to me. 0    Edinburgh Postnatal Depression Scale Total 1           The following portions of the patient's history were reviewed and updated as appropriate: allergies, current medications, past family history, past medical history, past social history, past surgical history and problem list.  Review of Systems Constitutional: negative Eyes: negative Ears, nose, mouth, throat, and face:  negative Respiratory: negative Cardiovascular: negative Gastrointestinal: positive for constipation Genitourinary:negative Integument/breast: negative Hematologic/lymphatic: negative Musculoskeletal:negative Neurological: negative Behavioral/Psych: negative Endocrine: negative Allergic/Immunologic: negative    Objective:  BP 120/86 (BP Location: Left Arm, Patient Position: Sitting, Cuff Size: Normal)   Pulse 89   Temp 98.3 F (36.8 C) (Oral)   Ht 5' 8.5" (1.74 m)   Wt 206 lb (93.4 kg)   LMP 01/12/2019   Breastfeeding Yes   BMI 30.86 kg/m    General:  alert, cooperative and no distress   Breasts:  inspection negative, no nipple discharge or bleeding, no masses or nodularity palpable  Lungs: clear to auscultation bilaterally  Heart:  regular rate and rhythm, S1, S2 normal, no murmur, click, rub or gallop  Abdomen: soft, non-tender; bowel sounds normal; no masses,  no organomegaly   Vulva:  not evaluated  Vagina: not evaluated  Cervix:  not evaluated  Corpus: not examined  Adnexa:  not evaluated  Rectal Exam: Not performed.        Assessment:  Encounter for postpartum care of lactating mother - Normal postpartum exam. Pap smear not done at today's visit.   Constipation, unspecified constipation type  - Rx for polyethylene glycol (MIRALAX) 17 g packet    Plan:   Essential components of care per ACOG recommendations:  1.  Mood and well being: Patient with negative depression screening today. Reviewed local resources for support.  -  Patient does not use tobacco.  - hx of drug use? No    2. Infant care and feeding:  -Patient currently breastmilk feeding? Yes If breastmilk feeding discussed return to work and pumping. If needed, patient was provided letter for work to allow for every 2-3 hr pumping breaks, and to be granted a private location to express breastmilk and refrigerated area to store breastmilk. Reviewed importance of draining breast regularly to support  lactation. -Social determinants of health (SDOH) reviewed in EPIC. No concerns  3. Sexuality, contraception and birth spacing - Patient does not want a pregnancy in the next year.  Desired family size is unsure of number of children.  - Reviewed forms of contraception in tiered fashion. Patient desired considering [facility] at [facility] today.   - Discussed birth spacing of 18 months  4. Sleep and fatigue -Encouraged family/partner/community support of 4 hrs of uninterrupted sleep to help with mood and fatigue  5. Physical Recovery  - Discussed patients delivery and complications - Patient had a 2nd degree perineal laceration, perineal healing reviewed. Patient expressed understanding - Patient has urinary incontinence? No - Patient is safe to resume physical and sexual activity  6.  Health Maintenance - Last pap smear done unknown and was unknown with negative HPV. Mammogram n/a  7. No Chronic Disease - PCP follow up  Raelyn Mora, CNM [facility] for [employer], [facility]

## 2019-12-26 ENCOUNTER — Encounter: Payer: Self-pay | Admitting: Obstetrics and Gynecology

## 2020-05-26 DIAGNOSIS — K8020 Calculus of gallbladder without cholecystitis without obstruction: Secondary | ICD-10-CM | POA: Diagnosis not present

## 2020-05-26 DIAGNOSIS — Z20822 Contact with and (suspected) exposure to covid-19: Secondary | ICD-10-CM | POA: Insufficient documentation

## 2020-05-26 DIAGNOSIS — R109 Unspecified abdominal pain: Secondary | ICD-10-CM | POA: Diagnosis present

## 2020-05-27 ENCOUNTER — Emergency Department (HOSPITAL_COMMUNITY): Payer: Medicaid Other

## 2020-05-27 ENCOUNTER — Other Ambulatory Visit: Payer: Self-pay

## 2020-05-27 ENCOUNTER — Emergency Department (HOSPITAL_COMMUNITY)
Admission: EM | Admit: 2020-05-27 | Discharge: 2020-05-27 | Disposition: A | Discharge status: Home / Self Care | Payer: Medicaid Other | Attending: Emergency Medicine | Admitting: Emergency Medicine

## 2020-05-27 ENCOUNTER — Encounter (HOSPITAL_COMMUNITY): Payer: Self-pay | Admitting: Emergency Medicine

## 2020-05-27 DIAGNOSIS — R109 Unspecified abdominal pain: Secondary | ICD-10-CM

## 2020-05-27 DIAGNOSIS — K8050 Calculus of bile duct without cholangitis or cholecystitis without obstruction: Secondary | ICD-10-CM

## 2020-05-27 LAB — URINALYSIS, ROUTINE W REFLEX MICROSCOPIC
Bilirubin Urine: NEGATIVE
Glucose, UA: NEGATIVE mg/dL
Ketones, ur: 5 mg/dL — AB
Leukocytes,Ua: NEGATIVE
Nitrite: NEGATIVE
Protein, ur: 30 mg/dL — AB
Specific Gravity, Urine: 1.028 (ref 1.005–1.030)
pH: 6 (ref 5.0–8.0)

## 2020-05-27 LAB — COMPREHENSIVE METABOLIC PANEL
ALT: 101 U/L — ABNORMAL HIGH (ref 0–44)
AST: 197 U/L — ABNORMAL HIGH (ref 15–41)
Albumin: 4.2 g/dL (ref 3.5–5.0)
Alkaline Phosphatase: 100 U/L (ref 38–126)
Anion gap: 8 (ref 5–15)
BUN: 17 mg/dL (ref 6–20)
CO2: 27 mmol/L (ref 22–32)
Calcium: 9.6 mg/dL (ref 8.9–10.3)
Chloride: 100 mmol/L (ref 98–111)
Creatinine, Ser: 0.64 mg/dL (ref 0.44–1.00)
GFR, Estimated: >60 mL/min (ref >60)
Glucose, Bld: 142 mg/dL — ABNORMAL HIGH (ref 70–99)
Potassium: 4.3 mmol/L (ref 3.5–5.1)
Sodium: 135 mmol/L (ref 135–145)
Total Bilirubin: 1.1 mg/dL (ref 0.3–1.2)
Total Protein: 8.1 g/dL (ref 6.5–8.1)

## 2020-05-27 LAB — CBC WITH DIFFERENTIAL/PLATELET
Abs Immature Granulocytes: 0.06 10*3/uL (ref 0.00–0.07)
Basophils Absolute: 0 10*3/uL (ref 0.0–0.1)
Basophils Relative: 0 %
Eosinophils Absolute: 0 10*3/uL (ref 0.0–0.5)
Eosinophils Relative: 0 %
HCT: 42.5 % (ref 36.0–46.0)
Hemoglobin: 13.6 g/dL (ref 12.0–15.0)
Immature Granulocytes: 0 %
Lymphocytes Relative: 7 %
Lymphs Abs: 1.3 10*3/uL (ref 0.7–4.0)
MCH: 25.5 pg — ABNORMAL LOW (ref 26.0–34.0)
MCHC: 32 g/dL (ref 30.0–36.0)
MCV: 79.6 fL — ABNORMAL LOW (ref 80.0–100.0)
Monocytes Absolute: 0.5 10*3/uL (ref 0.1–1.0)
Monocytes Relative: 3 %
Neutro Abs: 15.5 10*3/uL — ABNORMAL HIGH (ref 1.7–7.7)
Neutrophils Relative %: 90 %
Platelets: 369 10*3/uL (ref 150–400)
RBC: 5.34 MIL/uL — ABNORMAL HIGH (ref 3.87–5.11)
RDW: 14.5 % (ref 11.5–15.5)
WBC: 17.3 10*3/uL — ABNORMAL HIGH (ref 4.0–10.5)
nRBC: 0 % (ref 0.0–0.2)

## 2020-05-27 LAB — LIPASE, BLOOD: Lipase: 31 U/L (ref 11–51)

## 2020-05-27 LAB — RESP PANEL BY RT-PCR (FLU A&B, COVID) ARPGX2
Influenza A by PCR: NEGATIVE
Influenza B by PCR: NEGATIVE
SARS Coronavirus 2 by RT PCR: NEGATIVE

## 2020-05-27 LAB — I-STAT BETA HCG BLOOD, ED (MC, WL, AP ONLY): I-stat hCG, quantitative: <5 m[IU]/mL (ref <5)

## 2020-05-27 MED ORDER — ONDANSETRON 8 MG PO TBDP: 8.0000 mg | ORAL_TABLET | Freq: Three times a day (TID) | ORAL | 0 refills | Status: DC | PRN

## 2020-05-27 MED ORDER — AMOXICILLIN-POT CLAVULANATE 875-125 MG PO TABS: 1.0000 | ORAL_TABLET | Freq: Two times a day (BID) | ORAL | 0 refills | Status: DC

## 2020-05-27 MED ORDER — ONDANSETRON 4 MG PO TBDP
4.0000 mg | ORAL_TABLET | Freq: Once | ORAL | Status: DC
  Filled 2020-05-27 (×2): qty 1

## 2020-05-27 MED ORDER — SODIUM CHLORIDE 0.9 % IV SOLN
1.0000 g | Freq: Once | INTRAVENOUS | Status: AC
  Administered 2020-05-27: 1 g via INTRAVENOUS
  Filled 2020-05-27: qty 10

## 2020-05-27 MED ORDER — OXYCODONE-ACETAMINOPHEN 5-325 MG PO TABS
1.0000 | ORAL_TABLET | Freq: Once | ORAL | Status: DC
  Filled 2020-05-27 (×2): qty 1

## 2020-05-27 NOTE — ED Triage Notes (Signed)
Pt c/o abdominal pain, back pain, chills, nausea. Denies vomiting, diarrhea or constipation. Pt states pain started today, had similar episode 3 weeks ago. Pt denies urinary symptoms.

## 2020-05-27 NOTE — Consult Note (Signed)
Alison Martin 02-09-1995  [id number].    Requesting MD: Dr. Margarita Grizzle Chief Complaint/Reason for Consult: biliary colic  HPI:  This is a 25 yo Hispanic female with no PMH who is 6 months post partum.  She ate a quesadilla yesterday morning for breakfast and later developed epigastric abdominal pain, RUQ abdominal pain, and pain that radiated to her back and shoulder.  She developed nausea but no emesis.  This is her second episode.  Her first was about 3 months ago.  She presented to the [facility] late yesterday and was found to have elevated AST/ALT and WBC at 17K.  Her US revealed stones, but no evidence of cholecystitis.  Her pain has spontaneously "resolved" without intervention; however, given this is her second attack and she has some elevated labs and WBC, I was asked to see for possible admission and surgery.  ROS: ROS: Please see HPI, otherwise all other systems reviewed and are negative.  Family History  Problem Relation Age of Onset  . Hypertension Mother     Past Medical History:  Diagnosis Date  . Medical history non-contributory     Past Surgical History:  Procedure Laterality Date  . NO PAST SURGERIES      Social History:  reports that she has quit smoking. She has never used smokeless tobacco. She reports previous alcohol use. She reports that she does not use drugs.  Allergies: No Known Allergies  (Not in a hospital admission)    Physical Exam: Blood pressure 112/67, pulse 87, temperature 98.3 F (36.8 C), temperature source Oral, resp. rate 16, SpO2 99 %, currently breastfeeding. General: pleasant, WD, WN Hispanic female who is laying in bed in NAD HEENT: head is normocephalic, atraumatic.  Sclera are noninjected.  PERRL.  Ears and nose without any masses or lesions.  Mouth is pink and moist Heart: regular, rate, and rhythm.  Normal s1,s2. No obvious murmurs, gallops, or rubs noted.  Palpable radial and pedal pulses bilaterally Lungs: CTAB, no wheezes,  rhonchi, or rales noted.  Respiratory effort nonlabored Abd: soft, minimally tender in RUQ and epigastrium, ND, +BS, no masses, hernias, or organomegaly MS: all 4 extremities are symmetrical with no cyanosis, clubbing, or edema. Skin: warm and dry with no masses, lesions, or rashes Neuro: Cranial nerves 2-12 grossly intact, sensation is normal throughout Psych: A&Ox3 with an appropriate affect.   Results for orders placed or performed during the hospital encounter of 05/27/20 (from the past 48 hour(s))  Urinalysis, Routine w reflex microscopic Urine, Clean Catch     Status: Abnormal   Collection Time: 05/27/20 12:53 AM  Result Value Ref Range   Color, Urine AMBER (A) YELLOW    Comment: BIOCHEMICALS MAY BE AFFECTED BY COLOR   APPearance CLEAR CLEAR   Specific Gravity, Urine 1.028 1.005 - 1.030   pH 6.0 5.0 - 8.0   Glucose, UA NEGATIVE NEGATIVE mg/dL   Hgb urine dipstick SMALL (A) NEGATIVE   Bilirubin Urine NEGATIVE NEGATIVE   Ketones, ur 5 (A) NEGATIVE mg/dL   Protein, ur 30 (A) NEGATIVE mg/dL   Nitrite NEGATIVE NEGATIVE   Leukocytes,Ua NEGATIVE NEGATIVE   RBC / HPF 0-5 0 - 5 RBC/hpf   WBC, UA 0-5 0 - 5 WBC/hpf   Bacteria, UA RARE (A) NONE SEEN   Squamous Epithelial / LPF 0-5 0 - 5   Mucus PRESENT     Comment: Performed at [facility], [address], [place], [place] [address]  Comprehensive metabolic panel  Status: Abnormal   Collection Time: 05/27/20  1:03 AM  Result Value Ref Range   Sodium 135 135 - 145 mmol/L   Potassium 4.3 3.5 - 5.1 mmol/L   Chloride 100 98 - 111 mmol/L   CO2 27 22 - 32 mmol/L   Glucose, Bld 142 (H) 70 - 99 mg/dL    Comment: Glucose reference range applies only to samples taken after fasting for at least 8 hours.   BUN 17 6 - 20 mg/dL   Creatinine, Ser 9.56 0.44 - 1.00 mg/dL   Calcium 9.6 8.9 - 21.3 mg/dL   Total Protein 8.1 6.5 - 8.1 g/dL   Albumin 4.2 3.5 - 5.0 g/dL   AST 086 (H) 15 - 41 U/L   ALT 101 (H) 0 - 44 U/L   Alkaline  Phosphatase 100 38 - 126 U/L   Total Bilirubin 1.1 0.3 - 1.2 mg/dL   GFR, Estimated >57 >84 mL/min    Comment: (NOTE) Calculated using the CKD-EPI Creatinine Equation (2021)    Anion gap 8 5 - 15    Comment: Performed at [facility], [address] [place], [place] [address]  CBC with Differential     Status: Abnormal   Collection Time: 05/27/20  1:03 AM  Result Value Ref Range   WBC 17.3 (H) 4.0 - 10.5 K/uL   RBC 5.34 (H) 3.87 - 5.11 MIL/uL   Hemoglobin 13.6 12.0 - 15.0 g/dL   HCT 52.8 41.3 - 24.4 %   MCV 79.6 (L) 80.0 - 100.0 fL   MCH 25.5 (L) 26.0 - 34.0 pg   MCHC 32.0 30.0 - 36.0 g/dL   RDW 01.0 27.2 - 53.6 %   Platelets 369 150 - 400 K/uL   nRBC 0.0 0.0 - 0.2 %   Neutrophils Relative % 90 %   Neutro Abs 15.5 (H) 1.7 - 7.7 K/uL   Lymphocytes Relative 7 %   Lymphs Abs 1.3 0.7 - 4.0 K/uL   Monocytes Relative 3 %   Monocytes Absolute 0.5 0.1 - 1.0 K/uL   Eosinophils Relative 0 %   Eosinophils Absolute 0.0 0.0 - 0.5 K/uL   Basophils Relative 0 %   Basophils Absolute 0.0 0.0 - 0.1 K/uL   Immature Granulocytes 0 %   Abs Immature Granulocytes 0.06 0.00 - 0.07 K/uL    Comment: Performed at [facility], [address] [place], [place] [address]  Lipase, blood     Status: None   Collection Time: 05/27/20  1:03 AM  Result Value Ref Range   Lipase 31 11 - 51 U/L    Comment: Performed at [facility], [address], [place], [place] [address]  I-Stat beta hCG blood, ED     Status: None   Collection Time: 05/27/20  1:23 AM  Result Value Ref Range   I-stat hCG, quantitative <5.0 <5 mIU/mL   Comment 3            Comment:   GEST. AGE      CONC.  (mIU/mL)   <=1 WEEK        5 - 50     2 WEEKS       50 - 500     3 WEEKS       100 - 10,000     4 WEEKS     1,000 - 30,000        FEMALE AND NON-PREGNANT FEMALE:     LESS THAN 5  mIU/mL    US Abdomen Limited RUQ (LIVER/GB)  Result Date: 05/27/2020 CLINICAL DATA:  25 year old female with abdominal pain. EXAM:  ULTRASOUND ABDOMEN LIMITED RIGHT UPPER QUADRANT COMPARISON:  None. FINDINGS: Gallbladder: There multiple stones in the gallbladder. No gallbladder wall thickening or pericholecystic fluid. Negative sonographic Murphy's sign. Common bile duct: Diameter: 2 mm Liver: There is diffuse increased liver echogenicity most commonly seen in the setting of fatty infiltration. Superimposed inflammation or fibrosis is not excluded. Clinical correlation is recommended. Portal vein is patent on color Doppler imaging with normal direction of blood flow towards the liver. Other: None. IMPRESSION: 1. Cholelithiasis without sonographic evidence of acute cholecystitis. 2. Fatty liver. Electronically Signed   By: Elgie Collard M.D.   On: 05/27/2020 01:37      Assessment/Plan Biliary colic, ?early cholecystitis I had a long discussion with the patient using a Spanish interpretor.  We discussed the recommendation was for admission to the hospital for cholecystectomy given her WBC as well as recurrent episodes of biliary colic with gallstones and they likelihood this will return.  She asked if there were alternative treatments.  We discussed abx therapy, but I expressed my concern that given her multiple attacks and stones, that abx therapy was not definitive treatment and that she is likely to have a recurrence whether in the short term or long term.  She expressed understanding, but stated that her husband was leaving to go out of town on Monday for work and they have no family here to help out with her 35 month old and she would like to avoid surgery right now.  We agreed to a PO challenge and if she passes she can go home on 5 days of Augmentin given her elevated WBC, and I will arrange close follow up to discuss elective surgery.  We discussed return precautions such as worsening pain, inability to tolerate POs, N/V, fevers, etc.  If she develops these, she will need to return to the ED.  We also discussed a low fat diet and  education will be given in Spanish for her to read.  I discussed this all with EDP, Dr. Rosalia Hammers, who will PO challenge her and let me know if she fails.  Letha Cape, [facility] Surgery 05/27/2020, 11:40 AM Please see Amion for pager number during day hours 7:00am-4:30pm or 7:00am -11:30am on weekends

## 2020-05-27 NOTE — ED Provider Notes (Signed)
  Emergency Medicine Provider in Triage Note   MSE was initiated and I personally evaluated the patient  12:52 AM on May 27, 2020 as provider in triage.   Chief Complaint: Abdominal pain  HPI  Patient is a 25 y.o. who presents to the ED with complaints of abdominal pain since eating breakfast this AM. Pain is to the upper abdomen, radiates to the back some, worse after eating, no alleviating factors. Associated chills & nausea.    Review of Systems  Positive: Nausea, abdominal pain, chills Negative: Vomiting, dysuria, hematuria, diarrhea  Physical Exam  BP 114/67   Pulse 66   Temp 98.3 F (36.8 C) (Oral)   Resp 16   SpO2 99%    Gen:   Awake, no distress   HEENT:  Atraumatic  Resp:  Normal effort Cardiac:  Normal rate  Abd:   Nondistended, upper abdominal tenderness most prominent in the RUQ.  MSK:   Moves extremities without difficulty  Neuro:  Speech clear   Medical Decision Making   Initiation of care has begun. The patient has been counseled on the process, plan, and necessity for staying for the completion/evaluation, informed that the remainder of the evaluation will be completed by another provider, this initial triage assessment does not replace that evaluation, and the importance of remaining in the ED until their evaluation is complete.   Clinical Impression  Abdominal pain.   Interpretor utilized throughout triage process.         Cherly Anderson, PA-C 05/27/20 0054    Nira Conn, MD 05/30/20 2110

## 2020-05-27 NOTE — Discharge Instructions (Addendum)
Plan de alimentacin para problemas de vescula biliar Gallbladder Eating Plan Si tiene una afeccin de la vescula biliar, puede tener problemas para digerir las grasas. Consumir una dieta con bajo contenido de grasas puede [employer] sntomas, y puede ser beneficiosa antes y despus de [place] de extraccin de vescula biliar (colecistectoma). El mdico puede recomendarle que trabaje con un especialista en dietas y alimentacin (nutricionista) para que lo ayude a reducir la cantidad de grasas en su dieta. Consejos para seguir este plan [place] generales  Limite el consumo de grasas a menos del 30% del total de caloras diarias. Si usted ingiere alrededor de 1800 caloras diarias, esto es menos de 60 gramos (g) de Automotive engineer.  La grasa es una parte importante de una dieta saludable. Consumir una dieta con bajo contenido de grasas puede dificultar mantener un peso corporal saludable. Pregunte a su nutricionista qu cantidad de grasas, caloras y otros nutrientes necesita diariamente.  Haga comidas pequeas y frecuentes Freight forwarder de tres comidas abundantes.  Beba de 8 a 10 vasos de lquido por [employer]. Beba suficiente lquido como para mantener la orina clara o de color amarillo plido.  Limite el consumo de alcohol a no ms de por da si es mujer y no est [place], y a por da si es hombre. Una medida equivale a 12oz de Financial controller, 5oz de vino o 1oz de bebidas alcohlicas de alta graduacin. Al leer las etiquetas de los alimentos  Consulte la informacin nutricional en las etiquetas de los alimentos para conocer la cantidad de grasas por porcin. Elija alimentos con menos de 3 gramos de grasas por porcin.   Al ir de compras  Elija alimentos saludables sin grasas o con bajo contenido de grasas. Busque las palabras "sin grasa", "bajo en grasas" o "con bajo contenido de grasas".  Evite comprar alimentos procesados o preenvasados. Al  cocinar  Para cocinar opte por mtodos con bajo contenido de grasa, como hornear, hervir, Software engineer y Transport planner.  Cocine con pequeas cantidades de grasas saludables, como aceite de [place], aceite de semilla de [place], aceite de canola o [place]. Qu alimentos se recomiendan?  Todas las frutas y verduras frescas, congeladas o enlatadas.  Cereales integrales.  Leche y yogur semidescremados y descremados.  Vita [place], aves sin piel, pescado, huevos y legumbres.  Suplementos proteicos con bajo contenido de grasas, en polvo o lquidos.  Hierbas y especias. Qu alimentos no se recomiendan?  Alimentos muy grasos. Entre estos se incluyen productos panificados, comida rpida, cortes de carne con grasa, helados, pan francs, rosquillas dulces, pizza, pan de queso, alimentos cubiertos con [place], salsas con crema o queso.  Comidas fritas. Se incluyen papas fritas, tempura, pescado rebozado, milanesas de pollo, panes fritos y dulces.  Alimentos con [employer].  Alimentos que causan gases o meteorismo. Resumen  Una dieta de bajo contenido graso puede ser beneficiosa si tiene una afeccin de la vescula biliar o puede hacerla antes y despus de someterse a una ciruga de vescula.  Limite el consumo de grasas a menos del 30% del total de caloras diarias. Esto es casi 60 gramos de grasa si usted ingiere 1800 caloras diarias.  Haga comidas pequeas y frecuentes Freight forwarder de tres comidas abundantes. Esta informacin no tiene Theme park manager el consejo del mdico. Asegrese de hacerle al mdico cualquier pregunta que tenga. Document Revised: 10/28/2019 Document Reviewed: 10/28/2019 Elsevier Patient Education  2021 Elsevier Inc.   Colelitiasis Cholelithiasis  La colelitiasis ocurre cuando se forman clculos biliares en la vescula biliar. La vescula biliar almacena bilis. La bilis es un lquido que ayuda a [employer]. La bilis puede endurecerse y transformarse en  clculos biliares. Si estos causan una obstruccin, Magazine features editor (ataque de vescula biliar). Cules son las causas? Esta afeccin puede ser causada por lo siguiente:  Algunas enfermedades de la sangre, como la anemia drepanoctica.  Demasiada cantidad de una sustancia parecida a la grasa (colesterol) en la bilis.  No tener suficiente cantidad de sales biliares en la bilis. Estas sales le ayudan al organismo a Environmental health practitioner y a Mining engineer.  La vescula biliar no se vaca completamente o con suficiente frecuencia. Esto es frecuente en las mujeres embarazadas. Qu incrementa el riesgo? Los siguientes factores pueden hacer que sea ms propenso a Clinical cytogeneticist afeccin:  Ser mujer.  Estar embarazada muchas veces.  Comer muchos alimentos fritos, grasas y carbohidratos refinados.  Tener mucho sobrepeso (obesidad).  Ser mayor de 40aos de edad.  Usar medicamentos con hormonas femeninas durante [employer].  Adelgazar rpidamente.  Tener clculos biliares en la familia.  Tener algunos problemas de [place], como diabetes, enfermedad de [place] o enfermedad heptica. Cules son los signos o sntomas? A menudo, puede haber clculos biliares, pero sin sntomas. Estos clculos biliares se denominan clculos silenciosos. Si un clculo biliar provoca una obstruccin, puede sentir dolor repentino. El dolor:  Puede estar en la parte superior derecha del vientre (abdomen).  Normalmente aparece a la noche o despus de comer.  Puede durar Neomia Dear hora o ms.  Se puede extender hacia el hombro derecho, la espalda o el pecho.  Puede sentirse Radiographer, therapeutic, ardor o sensacin de plenitud en la parte superior del vientre (indigestin). Si la obstruccin dura ms de algunas horas, puede tener una infeccin o hinchazn. Usted puede:  Sentir que va a vomitar.  Vomitar.  Sentirse hinchado.  Tener dolor en el vientre durante 5 horas o ms.  Sentir dolor con la palpacin en el  vientre, a menudo en la parte superior derecha y debajo de las [place].  Tener fiebre o escalofros.  Notar que la piel o la zona blanca de los ojos se vuelven amarillas (ictericia).  Tener el pis (orina) oscuro o las deposiciones (heces) plidas. Cmo se trata? El tratamiento de esta afeccin depende de qu tan mal se sienta. Si tiene sntomas, puede necesitar lo siguiente:  Medical laboratory scientific officer, si los sntomas no son Lynnae Sandhoff graves. ? No coma durante 12 a 24horas. Beba solamente agua y lquidos claros. ? Comience a comer alimentos simples o claros despus de 1 o 2 das. Pruebe con caldos y galletas. ? Es posible que necesite medicamentos para Chief Technology Officer o Counsellor, o ambos. ? Si tiene una infeccin, necesitar antibiticos.  Hospitalizacin, si tiene un dolor muy intenso o una infeccin muy grave.  Ciruga para extirpar la vescula biliar. Puede ser necesario si: ? Los clculos biliares siguen apareciendo. ? Tiene sntomas muy graves.  Medicamentos para destruir los clculos biliares. Medicamentos: ? Son mejores para los clculos pequeos. ? Pueden usarse durante hasta 6 a 12 meses.  Un procedimiento para encontrar y extraer los clculos biliares o para fragmentarlos. Siga estas instrucciones en su casa: Medicamentos  Use los medicamentos de venta libre y los recetados solamente como se lo haya indicado el mdico.  Si le recetaron un antibitico, tmelo como se lo haya indicado el mdico. No deje de tomar el antibitico aunque comience  a sentirse mejor.  Pregntele al mdico si el medicamento recetado le impide conducir o usar [place]. Comida y bebida  Beba suficiente lquido como para Pharmacologist la orina de color amarillo plido. Beba agua o lquidos claros. Esto es importante cuando Electronics engineer.  Consuma alimentos saludables. Elija: ? Menos alimentos grasos, como las comidas fritas. ? Menos carbohidratos refinados. Evite los panes y los cereales muy  procesados, como el pan blanco y el arroz blanco. Elija cereales integrales, como el pan integral y [place] integral. ? Ms Bjorn Loser. [place], las frutas frescas y los frijoles son fuentes saludables. Instrucciones generales  Mantenga un peso saludable.  Concurra a todas las visitas de [address] se lo haya indicado el mdico. Esto es importante. Dnde buscar ms informacin  [employer] of Diabetes and Digestive and Kidney Diseases [employer] de la Diabetes y las [place] Digest[place] y [place]): CarFlippers.tn Comunquese con un mdico si:  Siente dolor repentino en el costado superior derecho del vientre. El dolor podra extenderse hasta el hombro derecho, la espalda o el pecho.  Le han diagnosticado clculos biliares que no presentan sntomas y tiene lo siguiente: ? Dolor abdominal. ? Molestias, ardor o sensacin de plenitud en la parte superior del abdomen.  La orina es de color oscuro o tiene heces plidas. Solicite ayuda de inmediato si:  Tiene dolor repentino en la parte superior derecha del abdomen y Chief Technology Officer dura ms de 2horas.  Tiene dolor en el abdomen y: ? Dura ms de 5horas. ? Sigue empeorando.  Tiene fiebre o escalofros.  Tiene ganas continuas de vomitar.  Sigue vomitando.  La piel y la parte blanca de los ojos se ponen amarillos. Resumen  La colelitiasis ocurre cuando se forman clculos biliares en la vescula biliar.  La causa de esta afeccin puede ser Neomia Dear enfermedad de la [place], una cantidad excesiva de una sustancia parecida a la grasa en la bilis o una cantidad insuficiente de sales biliares.  El tratamiento de esta afeccin depende de qu tan mal se sienta.  Si tiene sntomas, no coma ni beba. Es posible que necesite tomar medicamentos. Tal vez necesite hospitalizacin si tiene un dolor muy intenso o una infeccin muy grave.  Es posible que deba someterse a una ciruga si los clculos siguen apareciendo o si tiene  sntomas muy graves. Esta informacin no tiene Theme park manager el consejo del mdico. Asegrese de hacerle al mdico cualquier pregunta que tenga. Document Revised: 02/06/2019 Document Reviewed: 02/06/2019 Elsevier Patient Education  2021 [employer].

## 2020-05-27 NOTE — ED Notes (Signed)
Pt tolerating food and beverage.

## 2020-05-27 NOTE — ED Notes (Signed)
Patient verbalizes understanding of discharge instructions. Opportunity for questioning and answers were provided. Pt discharged from ED. 

## 2020-05-27 NOTE — ED Provider Notes (Signed)
[facility] EMERGENCY DEPARTMENT Provider Note   CSN: [id number] Arrival date & time: 05/26/20  2345     History Chief Complaint  Patient presents with  . Abdominal Pain    Alison Martin is a 25 y.o. female.  HPI Level 5 caveat secondary to language barrier, translator used 25 yo female g1p1 6 months postpartum with ruq with nausea but no vomiting.  Pain resolved here without intervention.  Patient has been npo since.  No fever, or diarrhea, no uti sxs- no pain or frequency.  No previous surgery.  Patient reports that she has had 2 episodes of pain recently. Covid vaccine x 1 , no second No pmd    Past Medical History:  Diagnosis Date  . Medical history non-contributory     Patient Active Problem List   Diagnosis Date Noted  . Vaginal delivery 11/12/2019  . Post-dates pregnancy 11/11/2019  . Chlamydia trachomatis infection in pregnancy in third trimester 10/15/2019  . Supervision of normal first pregnancy, antepartum 05/12/2019    Past Surgical History:  Procedure Laterality Date  . NO PAST SURGERIES       OB History    Gravida  1   Para  1   Term  1   Preterm      AB      Living  1     SAB      IAB      Ectopic      Multiple  0   Live Births  1           Family History  Problem Relation Age of Onset  . Hypertension Mother     Social History   Tobacco Use  . Smoking status: Former Games developer  . Smokeless tobacco: Never Used  . Tobacco comment: stopped about year ago  Vaping Use  . Vaping Use: Never used  Substance Use Topics  . Alcohol use: Not Currently    Comment: socially, none in the last year  . Drug use: Never    Home Medications Prior to Admission medications   Medication Sig Start Date End Date Taking? Authorizing Provider  acetaminophen (TYLENOL) 325 MG tablet Take 2 tablets (650 mg total) by mouth every 6 (six) hours. 11/14/19   Fayette Pho, MD  coconut oil OIL Apply 1 application topically as  needed. 11/14/19   Fayette Pho, MD  dibucaine (NUPERCAINAL) 1 % OINT Place 1 application rectally as needed for hemorrhoids. Patient not taking: Reported on 12/24/2019 11/14/19   Fayette Pho, MD  ibuprofen (ADVIL) 600 MG tablet Take 1 tablet (600 mg total) by mouth every 6 (six) hours. Patient not taking: Reported on 12/24/2019 11/14/19   Fayette Pho, MD  polyethylene glycol (MIRALAX) 17 g packet Take 17 g by mouth daily as needed for moderate constipation. 12/24/19   Raelyn Mora, CNM  Prenatal Vit-Fe Fumarate-FA (PRENATAL MULTIVITAMIN) TABS tablet Take 1 tablet by mouth daily at 12 noon. 11/14/19   Fayette Pho, MD    Allergies    Patient has no known allergies.  Review of Systems   Review of Systems  All other systems reviewed and are negative.   Physical Exam Updated Vital Signs BP 118/77 (BP Location: Left Arm)   Pulse 80   Temp 98.3 F (36.8 C) (Oral)   Resp (!) 22   SpO2 100%   Physical Exam Vitals and nursing note reviewed.  Constitutional:      Appearance: She is well-developed. She is obese.  HENT:  Head: Normocephalic.     Mouth/Throat:     Mouth: Mucous membranes are moist.  Cardiovascular:     Rate and Rhythm: Normal rate and regular rhythm.  Pulmonary:     Effort: Pulmonary effort is normal.     Breath sounds: Normal breath sounds.  Abdominal:     General: Abdomen is flat. Bowel sounds are normal. There is no distension.     Palpations: Abdomen is soft.     Tenderness: There is no abdominal tenderness.  Skin:    General: Skin is warm and dry.  Neurological:     General: No focal deficit present.     Mental Status: She is alert. She is disoriented.  Psychiatric:        Mood and Affect: Mood normal.        Behavior: Behavior normal.     ED Results / Procedures / Treatments   Labs (all labs ordered are listed, but only abnormal results are displayed) Labs Reviewed  COMPREHENSIVE METABOLIC PANEL - Abnormal; Notable for the  following components:      Result Value   Glucose, Bld 142 (*)    AST 197 (*)    ALT 101 (*)    All other components within normal limits  CBC WITH DIFFERENTIAL/PLATELET - Abnormal; Notable for the following components:   WBC 17.3 (*)    RBC 5.34 (*)    MCV 79.6 (*)    MCH 25.5 (*)    Neutro Abs 15.5 (*)    All other components within normal limits  URINALYSIS, ROUTINE W REFLEX MICROSCOPIC - Abnormal; Notable for the following components:   Color, Urine AMBER (*)    Hgb urine dipstick SMALL (*)    Ketones, ur 5 (*)    Protein, ur 30 (*)    Bacteria, UA RARE (*)    All other components within normal limits  LIPASE, BLOOD  I-STAT BETA HCG BLOOD, ED ([facility], [facility], [facility] ONLY)    EKG None  Radiology US Abdomen Limited RUQ (LIVER/GB)  Result Date: 05/27/2020 CLINICAL DATA:  25 year old female with abdominal pain. EXAM: ULTRASOUND ABDOMEN LIMITED RIGHT UPPER QUADRANT COMPARISON:  None. FINDINGS: Gallbladder: There multiple stones in the gallbladder. No gallbladder wall thickening or pericholecystic fluid. Negative sonographic Murphy's sign. Common bile duct: Diameter: 2 mm Liver: There is diffuse increased liver echogenicity most commonly seen in the setting of fatty infiltration. Superimposed inflammation or fibrosis is not excluded. Clinical correlation is recommended. Portal vein is patent on color Doppler imaging with normal direction of blood flow towards the liver. Other: None. IMPRESSION: 1. Cholelithiasis without sonographic evidence of acute cholecystitis. 2. Fatty liver. Electronically Signed   By: Elgie Collard M.D.   On: 05/27/2020 01:37    Procedures Procedures   Medications Ordered in ED Medications  ondansetron (ZOFRAN-ODT) disintegrating tablet 4 mg (has no administration in time range)  oxyCODONE-acetaminophen (PERCOCET/ROXICET) 5-325 MG per tablet 1 tablet (has no administration in time range)    ED Course  I have reviewed the triage vital signs and the nursing  notes.  Pertinent labs & imaging results that were available during my care of the patient were reviewed by me and considered in my medical decision making (see chart for details).    MDM Rules/Calculators/A&P                         25 year old female presents today with episode of biliary colic.  Transaminases are mildly elevated.  Ultrasound  is significant for cholelithiasis without any evidence of acute cholecystitis.  Symptoms have resolved.  Patient has been n.p.o. since yesterday.  Discussed with surgery Patient seen by surgery and offered to go to the operating room but does not want to have surgery today.  They advised that patient should be placed on Augmentin.  She is having a p.o. fluid challenge now. Patient tolerated p.o. fluids. Rx written for Augmentin and Zofran.  She is given referral to general surgery and information regarding biliary colic.  She is advised regarding return precautions and voiced understanding. Final Clinical Impression(s) / ED Diagnoses Final diagnoses:  Abdominal pain    Rx / DC Orders ED Discharge Orders    None       Margarita Grizzle, MD 05/27/20 1150

## 2021-01-15 NOTE — L&D Delivery Note (Signed)
OB/GYN Faculty Practice Delivery Note  Alison Martin is a 26 y.o. G2P1001 s/p vag del at [redacted]w[redacted]d. She was admitted for IOL due to postdates.   ROM: 0h 14m with clear fluid GBS Status: neg Maximum Maternal Temperature: 99.5  Labor Progress: Ms Moudy was admitted in the evening of 06/07/21 and given a single dose of cytotec followed by Pitocin as induction methods. At 8cm she had SROM with progression to delivery shortly afterwards.  Delivery Date/Time: May 25th, 2023 at 0710 Delivery: Called to room and patient was complete and pushing. Head delivered ROA. Nuchal cord present x 1, loose, reduced prior to del. Shoulder and body delivered in usual fashion. Infant with spontaneous cry, placed on mother's abdomen, dried and stimulated. Cord clamped x 2 after 1-minute delay, and cut by FOB. Cord blood drawn. Placenta delivered spontaneously with gentle cord traction. Fundus firm with massage and Pitocin. Labia, perineum, vagina, and cervix inspected and found to be intact other than a superficial abrasion at the posterior fourchette.   Placenta: spont, intact; to L&D Complications: none Lacerations: none EBL: 150cc Analgesia: none  Postpartum Planning [x]  message to sent to schedule follow-up    Infant: boy  APGARs 8/9  4110g (9lb 1oz)  10/9, CNM  06/08/2021 7:30 AM

## 2021-03-15 ENCOUNTER — Other Ambulatory Visit: Payer: Self-pay

## 2021-03-15 ENCOUNTER — Encounter: Payer: Self-pay | Admitting: Certified Nurse Midwife

## 2021-03-15 ENCOUNTER — Other Ambulatory Visit (HOSPITAL_COMMUNITY)
Admission: RE | Admit: 2021-03-15 | Discharge: 2021-03-15 | Disposition: A | Discharge status: Home / Self Care | Payer: Medicaid Other | Source: Ambulatory Visit | Attending: Certified Nurse Midwife | Admitting: Certified Nurse Midwife

## 2021-03-15 ENCOUNTER — Ambulatory Visit (INDEPENDENT_AMBULATORY_CARE_PROVIDER_SITE_OTHER): Payer: Medicaid Other | Admitting: Certified Nurse Midwife

## 2021-03-15 VITALS — BP 121/74 | HR 105 | Wt 213.6 lb

## 2021-03-15 DIAGNOSIS — Z3480 Encounter for supervision of other normal pregnancy, unspecified trimester: Secondary | ICD-10-CM | POA: Insufficient documentation

## 2021-03-15 DIAGNOSIS — Z3493 Encounter for supervision of normal pregnancy, unspecified, third trimester: Secondary | ICD-10-CM

## 2021-03-15 DIAGNOSIS — B9689 Other specified bacterial agents as the cause of diseases classified elsewhere: Secondary | ICD-10-CM

## 2021-03-15 DIAGNOSIS — Z8719 Personal history of other diseases of the digestive system: Secondary | ICD-10-CM

## 2021-03-15 DIAGNOSIS — Z3A29 29 weeks gestation of pregnancy: Secondary | ICD-10-CM

## 2021-03-15 DIAGNOSIS — O0933 Supervision of pregnancy with insufficient antenatal care, third trimester: Secondary | ICD-10-CM

## 2021-03-15 DIAGNOSIS — O98813 Other maternal infectious and parasitic diseases complicating pregnancy, third trimester: Secondary | ICD-10-CM

## 2021-03-15 DIAGNOSIS — A749 Chlamydial infection, unspecified: Secondary | ICD-10-CM

## 2021-03-15 DIAGNOSIS — N760 Acute vaginitis: Secondary | ICD-10-CM

## 2021-03-15 HISTORY — DX: Personal history of other diseases of the digestive system: Z87.19

## 2021-03-15 LAB — POCT URINALYSIS DIPSTICK
Blood, UA: NEGATIVE
Glucose, UA: NEGATIVE
Ketones, UA: POSITIVE
Leukocytes, UA: NEGATIVE
Nitrite, UA: NEGATIVE
Protein, UA: NEGATIVE
Spec Grav, UA: 1.015 (ref 1.010–1.025)
Urobilinogen, UA: 0.2 E.U./dL
pH, UA: 7 (ref 5.0–8.0)

## 2021-03-15 MED ORDER — PRENATAL PLUS VITAMIN/MINERAL 27-1 MG PO TABS: 1.0000 | ORAL_TABLET | Freq: Every day | ORAL | 5 refills | Status: DC

## 2021-03-15 NOTE — Progress Notes (Signed)
Pt presents to office for pap smear. After interviewing pt, pt states she is pregnant but is not sure how far along she is.  ?

## 2021-03-15 NOTE — Progress Notes (Signed)
? ?History:  ? Alison Martin is a 26 y.o. G2P1001 at [redacted]w[redacted]d by best clinical estimate (fundal height) being seen today for her first obstetrical visit.  Her obstetrical history is significant for  late prenatal care due to very irregular periods after last baby was born in 10/2019. States she one period that was 2 days long but nothing else. Was breastfeeding until 7-21mo postpartum. Had some nausea/vomiting a few months ago and has been feeling regular movement for the past few weeks which is why she took a pregnancy test. Was using condoms for birth control (not regularly) . Patient does intend to breast feed. Pregnancy history fully reviewed. ? ?Patient reports  concern about her untreated gallbladder disease. Was advised to get her gallbladder out but has not. Has figured out how to avoid flares by avoiding certain foods. Otherwise doing well, nausea has subsided and she can feel baby move regularly . ?  ?HISTORY: ?OB History  ?Gravida Para Term Preterm AB Living  ?2 1 1  0 0 1  ?SAB IAB Ectopic Multiple Live Births  ?0 0 0 0 1  ?  ?# Outcome Date GA Lbr Len/2nd Weight Sex Delivery Anes PTL Lv  ?2 Current           ?1 Term 11/12/19 [redacted]w[redacted]d 09:33 / 01:11 9 lb 12.1 oz (4.425 kg) F Vag-Spont EPI  LIV  ?   Birth Comments: WNL  ?   Name: LESLIEANNE, COBARRUBIAS  ?   Apgar1: 7  Apgar5: 9  ?  ?Last pap smear was done unknown, did not have in last pregnancy due to insurance, will need postpartum.  ? ?Past Medical History:  ?Diagnosis Date  ? Medical history non-contributory   ? ?Past Surgical History:  ?Procedure Laterality Date  ? NO PAST SURGERIES    ? ?Family History  ?Problem Relation Age of Onset  ? Hypertension Mother   ? ?Social History  ? ?Tobacco Use  ? Smoking status: Former  ? Smokeless tobacco: Never  ? Tobacco comments:  ?  stopped about year ago  ?Vaping Use  ? Vaping Use: Never used  ?Substance Use Topics  ? Alcohol use: Not Currently  ?  Comment: socially, none in the last year  ? Drug use: Never  ? ?No Known  Allergies ?Current Outpatient Medications on File Prior to Visit  ?Medication Sig Dispense Refill  ? Prenatal Vit-Fe Fumarate-FA (PRENATAL MULTIVITAMIN) TABS tablet Take 1 tablet by mouth daily at 12 noon.    ? acetaminophen (TYLENOL) 325 MG tablet Take 2 tablets (650 mg total) by mouth every 6 (six) hours. (Patient not taking: Reported on 03/15/2021)    ? amoxicillin-clavulanate (AUGMENTIN) 875-125 MG tablet Take 1 tablet by mouth every 12 (twelve) hours. (Patient not taking: Reported on 03/15/2021) 10 tablet 0  ? coconut oil OIL Apply 1 application topically as needed. (Patient not taking: Reported on 03/15/2021)  0  ? dibucaine (NUPERCAINAL) 1 % OINT Place 1 application rectally as needed for hemorrhoids. (Patient not taking: Reported on 12/24/2019)    ? ibuprofen (ADVIL) 600 MG tablet Take 1 tablet (600 mg total) by mouth every 6 (six) hours. (Patient not taking: Reported on 12/24/2019) 30 tablet 0  ? ondansetron (ZOFRAN ODT) 8 MG disintegrating tablet Take 1 tablet (8 mg total) by mouth every 8 (eight) hours as needed for nausea or vomiting. (Patient not taking: Reported on 03/15/2021) 20 tablet 0  ? polyethylene glycol (MIRALAX) 17 g packet Take 17 g by mouth daily as needed  for moderate constipation. (Patient not taking: Reported on 03/15/2021) 4 each 0  ? ?No current facility-administered medications on file prior to visit.  ? ? ?Review of Systems ?Pertinent items noted in HPI and remainder of comprehensive ROS otherwise negative. ?Physical Exam:  ? ?Vitals:  ? 03/15/21 1350  ?BP: 121/74  ?Pulse: (!) 105  ?Weight: 213 lb 9.6 oz (96.9 kg)  ? ?Fetal Heart Rate (bpm): 141 ? ?Physical Exam ?Vitals and nursing note reviewed.  ?Constitutional:   ?   General: She is not in acute distress. ?   Appearance: Normal appearance. She is not ill-appearing.  ?HENT:  ?   Head: Normocephalic and atraumatic.  ?   Mouth/Throat:  ?   Mouth: Mucous membranes are moist.  ?Eyes:  ?   Pupils: Pupils are equal, round, and reactive to light.   ?Cardiovascular:  ?   Rate and Rhythm: Normal rate and regular rhythm.  ?Pulmonary:  ?   Effort: Pulmonary effort is normal.  ?Abdominal:  ?   Palpations: Abdomen is soft.  ?   Tenderness: There is no abdominal tenderness.  ?   Comments: Obviously gravid  ?Genitourinary: ?   General: Normal vulva.  ?Musculoskeletal:     ?   General: Normal range of motion.  ?Skin: ?   General: Skin is warm.  ?   Capillary Refill: Capillary refill takes less than 2 seconds.  ?Neurological:  ?   Mental Status: She is alert and oriented to person, place, and time.  ?Psychiatric:     ?   Mood and Affect: Mood normal.     ?   Behavior: Behavior normal.     ?   Thought Content: Thought content normal.  ?  ?Assessment & Plan:  ?1. Supervision of low-risk pregnancy, third trimester ?- Doing well overall, feeling regular fetal movement ?- Shocked but seemed excited about the pregnancy ?- Cervicovaginal ancillary only ?- CBC/D/Plt+RPR+Rh+ABO+RubIgG... ?- Comprehensive metabolic panel ?- Culture, OB Urine ?- POCT Urinalysis Dipstick ?- [place] MFM OB DETAIL +14 WK; Future ?- Hemoglobin A1c ? ?2. [redacted] weeks gestation of pregnancy ?- Routine OB care - GTT scheduled for next visit, U/S to be scheduled asap, and next visit in two weeks. ? ?3. Initial obstetric visit in third trimester ?- Initial labs drawn. ?- Begin prenatal vitamins. ?- Problem list reviewed and updated. ?- Genetic Screening discussed, Quad screen: ordered. ?- Ultrasound discussed; fetal anatomic survey: ordered. ?- Anticipatory guidance about prenatal visits given including labs, ultrasounds, and testing. ?- Discussed usage of Babyscripts and virtual visits as additional source of managing and completing prenatal visits in midst of coronavirus and pandemic.   ?- Encouraged to complete MyChart Registration for her ability to review results, send requests, and have questions addressed.  ?- The nature of  - Center for [facility]Faculty Practice with multiple MDs and  Advanced Practice Providers was explained to patient; also emphasized that residents, students are part of our team. ?- Routine obstetric precautions reviewed. Encouraged to seek out care at office or emergency room [facility] preferred) for urgent and/or emergent concerns. ?Return in about 2 weeks (around 03/29/2021) for IN-PERSON, LOB/GTT.  ?  ?Future Appointments  ?Date Time Provider Department Center  ?03/22/2021  9:00 AM [facility] LAB [facility] None  ?03/29/2021  1:50 PM Brand Males, CNM [facility] None  ?04/04/2021 10:30 AM [facility] NURSE [facility]  ?04/04/2021 10:45 AM [facility] US5 [facility]  ? ?Edd Arbour, MSN, CNM, IBCLC ?Certified Nurse Midwife, [facility] ? ? ?

## 2021-03-16 LAB — COMPREHENSIVE METABOLIC PANEL
ALT: 8 IU/L (ref 0–32)
AST: 12 IU/L (ref 0–40)
Albumin/Globulin Ratio: 1.5 (ref 1.2–2.2)
Albumin: 4 g/dL (ref 3.9–5.0)
Alkaline Phosphatase: 79 IU/L (ref 44–121)
BUN/Creatinine Ratio: 18 (ref 9–23)
BUN: 8 mg/dL (ref 6–20)
Bilirubin Total: 0.4 mg/dL (ref 0.0–1.2)
CO2: 21 mmol/L (ref 20–29)
Calcium: 9.8 mg/dL (ref 8.7–10.2)
Chloride: 102 mmol/L (ref 96–106)
Creatinine, Ser: 0.45 mg/dL — ABNORMAL LOW (ref 0.57–1.00)
Globulin, Total: 2.7 g/dL (ref 1.5–4.5)
Glucose: 70 mg/dL (ref 70–99)
Potassium: 3.7 mmol/L (ref 3.5–5.2)
Sodium: 138 mmol/L (ref 134–144)
Total Protein: 6.7 g/dL (ref 6.0–8.5)
eGFR: 137 mL/min/{1.73_m2} (ref >59)

## 2021-03-16 LAB — CBC/D/PLT+RPR+RH+ABO+RUBIGG...
Antibody Screen: NEGATIVE
Basophils Absolute: 0 10*3/uL (ref 0.0–0.2)
Basos: 0 %
EOS (ABSOLUTE): 0 10*3/uL (ref 0.0–0.4)
Eos: 0 %
HCV Ab: NONREACTIVE
HIV Screen 4th Generation wRfx: NONREACTIVE
Hematocrit: 35.7 % (ref 34.0–46.6)
Hemoglobin: 12.1 g/dL (ref 11.1–15.9)
Hepatitis B Surface Ag: NEGATIVE
Immature Grans (Abs): 0.1 10*3/uL (ref 0.0–0.1)
Immature Granulocytes: 1 %
Lymphocytes Absolute: 2.1 10*3/uL (ref 0.7–3.1)
Lymphs: 17 %
MCH: 26.7 pg (ref 26.6–33.0)
MCHC: 33.9 g/dL (ref 31.5–35.7)
MCV: 79 fL (ref 79–97)
Monocytes Absolute: 0.4 10*3/uL (ref 0.1–0.9)
Monocytes: 3 %
Neutrophils Absolute: 9.9 10*3/uL — ABNORMAL HIGH (ref 1.4–7.0)
Neutrophils: 79 %
Platelets: 361 10*3/uL (ref 150–450)
RBC: 4.54 x10E6/uL (ref 3.77–5.28)
RDW: 13.7 % (ref 11.7–15.4)
RPR Ser Ql: NONREACTIVE
Rh Factor: POSITIVE
Rubella Antibodies, IGG: 1.92 index (ref >0.99)
WBC: 12.6 10*3/uL — ABNORMAL HIGH (ref 3.4–10.8)

## 2021-03-16 LAB — HCV INTERPRETATION

## 2021-03-16 LAB — HEMOGLOBIN A1C
Est. average glucose Bld gHb Est-mCnc: 114 mg/dL
Hgb A1c MFr Bld: 5.6 % (ref 4.8–5.6)

## 2021-03-17 LAB — CERVICOVAGINAL ANCILLARY ONLY
Bacterial Vaginitis (gardnerella): POSITIVE — AB
Candida Glabrata: NEGATIVE
Candida Vaginitis: NEGATIVE
Chlamydia: POSITIVE — AB
Comment: NEGATIVE
Comment: NEGATIVE
Comment: NEGATIVE
Comment: NEGATIVE
Comment: NEGATIVE
Comment: NORMAL
Neisseria Gonorrhea: NEGATIVE
Trichomonas: NEGATIVE

## 2021-03-17 LAB — URINE CULTURE, OB REFLEX: Organism ID, Bacteria: NO GROWTH

## 2021-03-17 LAB — CULTURE, OB URINE

## 2021-03-18 MED ORDER — AZITHROMYCIN 250 MG PO TABS: 1000.0000 mg | ORAL_TABLET | Freq: Once | ORAL | 0 refills | Status: AC

## 2021-03-18 MED ORDER — METRONIDAZOLE 0.75 % VA GEL: 1.0000 | Freq: Every day | VAGINAL | 1 refills | Status: DC

## 2021-03-18 NOTE — Addendum Note (Signed)
Addended by: Gaylan Gerold on: 03/18/2021 09:04 PM ? ? Modules accepted: Orders ? ?

## 2021-03-20 ENCOUNTER — Telehealth: Payer: Self-pay | Admitting: *Deleted

## 2021-03-20 NOTE — Telephone Encounter (Signed)
-----   Message from Bernerd Limbo, [place] sent at 03/18/2021  9:03 PM EST ----- ?Regarding: Pt Needs Results ?I may have already sent a message about this, if so please disregard. Can you please call this patient to let her know about her chlamydia and BV results? I have sent both prescriptions to the pharmacy, she just needs to be informed. Thank you! ? ?

## 2021-03-20 NOTE — Telephone Encounter (Signed)
I called patient with [name] and left message we are calling about results and prescription, please call office back.  ?[name] ?

## 2021-03-21 NOTE — Telephone Encounter (Signed)
Called patient, no answer- left message to call us back for results. Also called emergency contact phone number and requested he have the patient call us back. Per chart review, patient has appt tomorrow for GTT at [facility] office. ?

## 2021-03-22 ENCOUNTER — Other Ambulatory Visit: Payer: Self-pay

## 2021-03-22 ENCOUNTER — Other Ambulatory Visit: Payer: Medicaid Other

## 2021-03-22 ENCOUNTER — Other Ambulatory Visit: Payer: Self-pay | Admitting: *Deleted

## 2021-03-22 DIAGNOSIS — O98813 Other maternal infectious and parasitic diseases complicating pregnancy, third trimester: Secondary | ICD-10-CM | POA: Insufficient documentation

## 2021-03-22 DIAGNOSIS — Z3493 Encounter for supervision of normal pregnancy, unspecified, third trimester: Secondary | ICD-10-CM

## 2021-03-22 DIAGNOSIS — A749 Chlamydial infection, unspecified: Secondary | ICD-10-CM

## 2021-03-22 HISTORY — DX: Chlamydial infection, unspecified: O98.813

## 2021-03-22 MED ORDER — AZITHROMYCIN 500 MG PO TABS: 1000.0000 mg | ORAL_TABLET | Freq: Once | ORAL | 1 refills | Status: AC

## 2021-03-22 NOTE — Progress Notes (Signed)
Positive Chlamydia result noted. Pt in office for "lab only" GTT. Pt taken to nurse visits for notification and counseling. RX Azithromycin 1 GM sent to pharmacy per protocol. Stressed the importance of treatment in pregnancy and of treatment of partner. ?

## 2021-03-23 LAB — CBC
Hematocrit: 35.2 % (ref 34.0–46.6)
Hemoglobin: 11.6 g/dL (ref 11.1–15.9)
MCH: 26.3 pg — ABNORMAL LOW (ref 26.6–33.0)
MCHC: 33 g/dL (ref 31.5–35.7)
MCV: 80 fL (ref 79–97)
Platelets: 308 10*3/uL (ref 150–450)
RBC: 4.41 x10E6/uL (ref 3.77–5.28)
RDW: 13.7 % (ref 11.7–15.4)
WBC: 11.5 10*3/uL — ABNORMAL HIGH (ref 3.4–10.8)

## 2021-03-23 LAB — GLUCOSE TOLERANCE, 2 HOURS W/ 1HR
Glucose, 1 hour: 176 mg/dL (ref 70–179)
Glucose, 2 hour: 138 mg/dL (ref 70–152)
Glucose, Fasting: 83 mg/dL (ref 70–91)

## 2021-03-23 LAB — RPR: RPR Ser Ql: NONREACTIVE

## 2021-03-23 LAB — HIV ANTIBODY (ROUTINE TESTING W REFLEX): HIV Screen 4th Generation wRfx: NONREACTIVE

## 2021-03-29 ENCOUNTER — Other Ambulatory Visit: Payer: Self-pay

## 2021-03-29 ENCOUNTER — Ambulatory Visit (INDEPENDENT_AMBULATORY_CARE_PROVIDER_SITE_OTHER): Payer: Medicaid Other

## 2021-03-29 VITALS — BP 123/73 | HR 91 | Wt 212.0 lb

## 2021-03-29 DIAGNOSIS — Z3A31 31 weeks gestation of pregnancy: Secondary | ICD-10-CM

## 2021-03-29 DIAGNOSIS — A749 Chlamydial infection, unspecified: Secondary | ICD-10-CM

## 2021-03-29 DIAGNOSIS — Z3480 Encounter for supervision of other normal pregnancy, unspecified trimester: Secondary | ICD-10-CM

## 2021-03-29 DIAGNOSIS — O98813 Other maternal infectious and parasitic diseases complicating pregnancy, third trimester: Secondary | ICD-10-CM

## 2021-03-29 NOTE — Progress Notes (Signed)
? ?  PRENATAL VISIT NOTE ? ?Subjective:  ?Alison Martin is a 26 y.o. G2P1001 at [redacted]w[redacted]d being seen today for ongoing prenatal care.  She is currently monitored for the following issues for this low-risk pregnancy and has Supervision of other normal pregnancy, antepartum; History of cholecystitis; and Chlamydia infection affecting pregnancy in third trimester on their problem list. ? ?Patient reports no complaints.  Contractions: Not present. Vag. Bleeding: None.  Movement: Present. Denies leaking of fluid.  ? ?The following portions of the patient's history were reviewed and updated as appropriate: allergies, current medications, past family history, past medical history, past social history, past surgical history and problem list.  ? ?Objective:  ? ?Vitals:  ? 03/29/21 1406  ?BP: 123/73  ?Pulse: 91  ?Weight: 212 lb (96.2 kg)  ? ? ?Fetal Status: Fetal Heart Rate (bpm): 141 Fundal Height: 31 cm Movement: Present    ? ?General:  Alert, oriented and cooperative. Patient is in no acute distress.  ?Skin: Skin is warm and dry. No rash noted.   ?Cardiovascular: Normal heart rate noted  ?Respiratory: Normal respiratory effort, no problems with respiration noted  ?Abdomen: Soft, gravid, appropriate for gestational age.  Pain/Pressure: Present     ?Pelvic: Cervical exam deferred        ?Extremities: Normal range of motion.  Edema: None  ?Mental Status: Normal mood and affect. Normal behavior. Normal judgment and thought content.  ? ?Assessment and Plan:  ?Pregnancy: G2P1001 at [redacted]w[redacted]d ?1. Supervision of other normal pregnancy, antepartum ?- Routine OB. Doing well, no concerns ?- Spanish interpretor used for today's visit ?- Anticipatory guidance for upcoming appointments provided ? ?2. [redacted] weeks gestation of pregnancy ?- Endorses active fetal movement ? ?3. Chlamydia infection affecting pregnancy in third trimester ?- Completed medication 2 days ago ?- Partner still has not received treatment, will go to HD ?- Patient has  abstained from intercourse and was reminded to abstain from unprotected intercourse for at least 7 days after partner has been treated ? ?Preterm labor symptoms and general obstetric precautions including but not limited to vaginal bleeding, contractions, leaking of fluid and fetal movement were reviewed in detail with the patient. ?Please refer to After Visit Summary for other counseling recommendations.  ? ?Return in about 2 weeks (around 04/12/2021). ? ?Future Appointments  ?Date Time Provider Department Center  ?04/04/2021 10:30 AM [facility] NURSE [facility]  ?04/04/2021 10:45 AM [facility] US5 [facility]  ?04/12/2021  2:30 PM Allayne Stack, DO [facility] None  ? ? ?Brand Males, CNM ? ?

## 2021-04-04 ENCOUNTER — Ambulatory Visit: Payer: Medicaid Other | Attending: Certified Nurse Midwife

## 2021-04-04 ENCOUNTER — Encounter: Payer: Self-pay | Admitting: *Deleted

## 2021-04-04 ENCOUNTER — Ambulatory Visit: Payer: Medicaid Other | Admitting: *Deleted

## 2021-04-04 ENCOUNTER — Other Ambulatory Visit: Payer: Self-pay

## 2021-04-04 ENCOUNTER — Other Ambulatory Visit: Payer: Self-pay | Admitting: *Deleted

## 2021-04-04 VITALS — BP 121/67 | HR 73

## 2021-04-04 DIAGNOSIS — O0933 Supervision of pregnancy with insufficient antenatal care, third trimester: Secondary | ICD-10-CM

## 2021-04-04 DIAGNOSIS — O99213 Obesity complicating pregnancy, third trimester: Secondary | ICD-10-CM

## 2021-04-04 DIAGNOSIS — A749 Chlamydial infection, unspecified: Secondary | ICD-10-CM | POA: Insufficient documentation

## 2021-04-04 DIAGNOSIS — Z3493 Encounter for supervision of normal pregnancy, unspecified, third trimester: Secondary | ICD-10-CM | POA: Diagnosis not present

## 2021-04-04 DIAGNOSIS — Z363 Encounter for antenatal screening for malformations: Secondary | ICD-10-CM | POA: Insufficient documentation

## 2021-04-04 DIAGNOSIS — Z3A31 31 weeks gestation of pregnancy: Secondary | ICD-10-CM | POA: Diagnosis not present

## 2021-04-04 DIAGNOSIS — O98813 Other maternal infectious and parasitic diseases complicating pregnancy, third trimester: Secondary | ICD-10-CM | POA: Insufficient documentation

## 2021-04-12 ENCOUNTER — Encounter: Payer: Self-pay | Admitting: Family Medicine

## 2021-04-12 ENCOUNTER — Ambulatory Visit (INDEPENDENT_AMBULATORY_CARE_PROVIDER_SITE_OTHER): Payer: Medicaid Other | Admitting: Family Medicine

## 2021-04-12 VITALS — Wt 208.0 lb

## 2021-04-12 DIAGNOSIS — A749 Chlamydial infection, unspecified: Secondary | ICD-10-CM

## 2021-04-12 DIAGNOSIS — Z3480 Encounter for supervision of other normal pregnancy, unspecified trimester: Secondary | ICD-10-CM

## 2021-04-12 DIAGNOSIS — Z3A33 33 weeks gestation of pregnancy: Secondary | ICD-10-CM

## 2021-04-12 DIAGNOSIS — O98813 Other maternal infectious and parasitic diseases complicating pregnancy, third trimester: Secondary | ICD-10-CM

## 2021-04-12 NOTE — Progress Notes (Signed)
ROB [redacted] wks GA ?Reports some vaginal pressure and occasional abdominal pain. Reviewed s/s PTL. ? ?

## 2021-04-12 NOTE — Progress Notes (Signed)
? ? ?  Subjective:  ?Alison Martin is a 26 y.o. G2P1001 at [redacted]w[redacted]d being seen today for ongoing prenatal care.  She is currently monitored for the following issues for this low-risk pregnancy and has Supervision of other normal pregnancy, antepartum; History of cholecystitis; and Chlamydia infection affecting pregnancy in third trimester on their problem list. ? ?Patient reports no complaints. Completed azithromycin treatment but partner hasn't been treated. She has not been sexually active with him since prior to completing treatment.  Contractions: Not present. Vag. Bleeding: None.  Movement: Present. Denies leaking of fluid.  ? ?The following portions of the patient's history were reviewed and updated as appropriate: allergies, current medications, past family history, past medical history, past social history, past surgical history and problem list.  ? ?Objective:  ? ?Vitals:  ? 04/12/21 1414  ?Weight: 208 lb (94.3 kg)  ? ? ?Fetal Status: Fetal Heart Rate (bpm): 142 Fundal Height: 33 cm Movement: Present    ? ?General:  Alert, oriented and cooperative. Patient is in no acute distress.  ?Skin: Skin is warm and dry. No rash noted.   ?Cardiovascular: Normal heart rate noted  ?Respiratory: Normal respiratory effort, no problems with respiration noted  ?Abdomen: Soft, gravid, appropriate for gestational age. Pain/Pressure: Present     ?Pelvic:  Cervical exam deferred        ?Extremities: Normal range of motion.  Edema: None  ?Mental Status: Normal mood and affect. Normal behavior. Normal judgment and thought content.  ? ? ?Assessment and Plan:  ?Pregnancy: G2P1001 at [redacted]w[redacted]d ? ?1. Supervision of other normal pregnancy, antepartum ?Doing well with normal fetal movement.  ? ?2. [redacted] weeks gestation of pregnancy ? ?3. Chlamydia infection affecting pregnancy in third trimester ?Recently completed treatment. Discussed importance for partner to complete treatment prior to being sexually active again. She endorsed understanding  and will have him do this ASAP.  ? ?In-person spanish interpreter used.  ? ?Preterm labor symptoms and general obstetric precautions including but not limited to vaginal bleeding, contractions, leaking of fluid and fetal movement were reviewed in detail with the patient. ?Please refer to After Visit Summary for other counseling recommendations.  ? ?Return in about 2 weeks (around 04/26/2021) for LROB. ? ? ?Allayne Stack, DO ?

## 2021-04-27 ENCOUNTER — Ambulatory Visit (INDEPENDENT_AMBULATORY_CARE_PROVIDER_SITE_OTHER): Payer: Medicaid Other

## 2021-04-27 VITALS — BP 115/75 | HR 92 | Wt 218.0 lb

## 2021-04-27 DIAGNOSIS — O98813 Other maternal infectious and parasitic diseases complicating pregnancy, third trimester: Secondary | ICD-10-CM

## 2021-04-27 DIAGNOSIS — A749 Chlamydial infection, unspecified: Secondary | ICD-10-CM

## 2021-04-27 DIAGNOSIS — Z789 Other specified health status: Secondary | ICD-10-CM

## 2021-04-27 DIAGNOSIS — Z3A35 35 weeks gestation of pregnancy: Secondary | ICD-10-CM

## 2021-04-27 DIAGNOSIS — Z603 Acculturation difficulty: Secondary | ICD-10-CM

## 2021-04-27 DIAGNOSIS — Z3480 Encounter for supervision of other normal pregnancy, unspecified trimester: Secondary | ICD-10-CM

## 2021-04-27 NOTE — Progress Notes (Signed)
? ?  PRENATAL VISIT NOTE ? ?Subjective:  ?Alison Martin is a 26 y.o. G2P1001 at [redacted]w[redacted]d being seen today for ongoing prenatal care.  She is currently monitored for the following issues for this low-risk pregnancy and has Supervision of other normal pregnancy, antepartum; History of cholecystitis; and Chlamydia infection affecting pregnancy in third trimester on their problem list. ? ?Patient reports no complaints.  Contractions: Not present. Vag. Bleeding: None.  Movement: Present. Denies leaking of fluid.  ? ?The following portions of the patient's history were reviewed and updated as appropriate: allergies, current medications, past family history, past medical history, past social history, past surgical history and problem list.  ? ?Objective:  ? ?Vitals:  ? 04/27/21 1335  ?BP: 115/75  ?Pulse: 92  ?Weight: 218 lb (98.9 kg)  ? ? ?Fetal Status: Fetal Heart Rate (bpm): 154 Fundal Height: 35 cm Movement: Present    ? ?General:  Alert, oriented and cooperative. Patient is in no acute distress.  ?Skin: Skin is warm and dry. No rash noted.   ?Cardiovascular: Normal heart rate noted  ?Respiratory: Normal respiratory effort, no problems with respiration noted  ?Abdomen: Soft, gravid, appropriate for gestational age.  Pain/Pressure: Present     ?Pelvic: Cervical exam deferred        ?Extremities: Normal range of motion.  Edema: Trace  ?Mental Status: Normal mood and affect. Normal behavior. Normal judgment and thought content.  ? ?Assessment and Plan:  ?Pregnancy: G2P1001 at [redacted]w[redacted]d ?1. Supervision of other normal pregnancy, antepartum ?- Routine OB. Doing well, no concerns ?- Anticipatory guidance for upcoming appointments provided ?- Cultures next weeks ? ?2. [redacted] weeks gestation of pregnancy ?- FH appropriate ?- Endorses active fetal movement ? ?3. Chlamydia infection affecting pregnancy in third trimester ?- S/p treatment ?- Partner currently has 2 doses of treatment left ?- Both she and partner have abstained from  intercourse. She was instructed on importance of continuing abstaining until 7 days after partner has completed treatment ?- Will repeat culture next week ? ?4. Language barrier affecting health care ?- In person interpretor used for today's visit ? ? ?Preterm labor symptoms and general obstetric precautions including but not limited to vaginal bleeding, contractions, leaking of fluid and fetal movement were reviewed in detail with the patient. ?Please refer to After Visit Summary for other counseling recommendations.  ? ?Return in about 1 week (around 05/04/2021). ? ?Future Appointments  ?Date Time Provider Department Center  ?05/03/2021  1:30 PM Brand Males, CNM [facility]  ?05/05/2021 11:15 AM [facility] NURSE [facility]  ?05/05/2021 11:30 AM [facility] US3 [facility]  ? ? ?Brand Males, CNM ? ?

## 2021-05-03 ENCOUNTER — Ambulatory Visit (INDEPENDENT_AMBULATORY_CARE_PROVIDER_SITE_OTHER): Payer: Medicaid Other

## 2021-05-03 ENCOUNTER — Other Ambulatory Visit (HOSPITAL_COMMUNITY)
Admission: RE | Admit: 2021-05-03 | Discharge: 2021-05-03 | Disposition: A | Discharge status: Home / Self Care | Payer: Medicaid Other | Source: Ambulatory Visit

## 2021-05-03 VITALS — BP 126/75 | HR 96 | Wt 220.0 lb

## 2021-05-03 DIAGNOSIS — Z3480 Encounter for supervision of other normal pregnancy, unspecified trimester: Secondary | ICD-10-CM

## 2021-05-03 DIAGNOSIS — Z3A36 36 weeks gestation of pregnancy: Secondary | ICD-10-CM

## 2021-05-03 DIAGNOSIS — R12 Heartburn: Secondary | ICD-10-CM

## 2021-05-03 DIAGNOSIS — O26899 Other specified pregnancy related conditions, unspecified trimester: Secondary | ICD-10-CM

## 2021-05-03 MED ORDER — FAMOTIDINE 20 MG PO TABS: 20.0000 mg | ORAL_TABLET | Freq: Two times a day (BID) | ORAL | 2 refills | Status: DC

## 2021-05-03 NOTE — Progress Notes (Signed)
? ?  PRENATAL VISIT NOTE ? ?Subjective:  ?Alison Martin is a 26 y.o. G2P1001 at [redacted]w[redacted]d being seen today for ongoing prenatal care.  She is currently monitored for the following issues for this low-risk pregnancy and has Supervision of other normal pregnancy, antepartum; History of cholecystitis; and Chlamydia infection affecting pregnancy in third trimester on their problem list. ? ?Patient reports upper abdominal/epigastric pressure. No other symptoms. Contractions: Not present. Vag. Bleeding: None.  Movement: Present. Denies leaking of fluid.  ? ?The following portions of the patient's history were reviewed and updated as appropriate: allergies, current medications, past family history, past medical history, past social history, past surgical history and problem list.  ? ?Objective:  ? ?Vitals:  ? 05/03/21 1326  ?BP: 126/75  ?Pulse: 96  ?Weight: 220 lb (99.8 kg)  ? ? ?Fetal Status: Fetal Heart Rate (bpm): 141 Fundal Height: 36 cm Movement: Present    ? ?General:  Alert, oriented and cooperative. Patient is in no acute distress.  ?Skin: Skin is warm and dry. No rash noted.   ?Cardiovascular: Normal heart rate noted  ?Respiratory: Normal respiratory effort, no problems with respiration noted  ?Abdomen: Soft, gravid, appropriate for gestational age.  Pain/Pressure: Present     ?Pelvic: Cervical exam performed in the presence of a chaperone Dilation: Closed      ?Extremities: Normal range of motion.  Edema: Trace  ?Mental Status: Normal mood and affect. Normal behavior. Normal judgment and thought content.  ? ?Assessment and Plan:  ?Pregnancy: G2P1001 at [redacted]w[redacted]d ?1. Supervision of other normal pregnancy, antepartum ?- Routine OB. Doing well ?- Requesting cervical exam today ?- Anticipatory guidance for upcoming appointments provided ?- Map with directions to hospital provided  ?- Spanish interpretor used for today's visit ? ?- Cervicovaginal ancillary only( [place]) ?- Culture, beta strep (group b only) ? ?2. [redacted]  weeks gestation of pregnancy ?- Endorses active fetal movement ?- FH appropriate ? ?3. Heartburn during pregnancy, antepartum ?- Upper abdominal pressure almost daily, no other symptoms. Will trial course of pepcid. May also use Tums prn ? ?- famotidine (PEPCID) 20 MG tablet; Take 1 tablet (20 mg total) by mouth 2 (two) times daily.  Dispense: 60 tablet; Refill: 2 ? ? ?Preterm labor symptoms and general obstetric precautions including but not limited to vaginal bleeding, contractions, leaking of fluid and fetal movement were reviewed in detail with the patient. ?Please refer to After Visit Summary for other counseling recommendations.  ? ?Return in about 1 week (around 05/10/2021). ? ?Future Appointments  ?Date Time Provider Department Center  ?05/05/2021 11:15 AM [facility] NURSE [facility]  ?05/05/2021 11:30 AM [facility] US3 [facility]  ?05/10/2021  1:30 PM Brock Bad, MD [facility] None  ? ? ?Brand Males, CNM ? ?

## 2021-05-03 NOTE — Progress Notes (Signed)
ROB GBS 

## 2021-05-04 LAB — CERVICOVAGINAL ANCILLARY ONLY
Chlamydia: NEGATIVE
Comment: NEGATIVE
Comment: NEGATIVE
Comment: NORMAL
Neisseria Gonorrhea: NEGATIVE
Trichomonas: NEGATIVE

## 2021-05-05 ENCOUNTER — Ambulatory Visit: Payer: Medicaid Other | Attending: Obstetrics

## 2021-05-05 ENCOUNTER — Ambulatory Visit: Payer: Medicaid Other | Admitting: *Deleted

## 2021-05-05 ENCOUNTER — Encounter: Payer: Self-pay | Admitting: *Deleted

## 2021-05-05 VITALS — BP 114/67 | HR 84

## 2021-05-05 DIAGNOSIS — A749 Chlamydial infection, unspecified: Secondary | ICD-10-CM

## 2021-05-05 DIAGNOSIS — Z3493 Encounter for supervision of normal pregnancy, unspecified, third trimester: Secondary | ICD-10-CM | POA: Insufficient documentation

## 2021-05-05 DIAGNOSIS — O0933 Supervision of pregnancy with insufficient antenatal care, third trimester: Secondary | ICD-10-CM | POA: Insufficient documentation

## 2021-05-05 DIAGNOSIS — O99213 Obesity complicating pregnancy, third trimester: Secondary | ICD-10-CM | POA: Insufficient documentation

## 2021-05-05 DIAGNOSIS — E669 Obesity, unspecified: Secondary | ICD-10-CM | POA: Diagnosis not present

## 2021-05-05 DIAGNOSIS — Z3A36 36 weeks gestation of pregnancy: Secondary | ICD-10-CM | POA: Diagnosis not present

## 2021-05-05 DIAGNOSIS — O98813 Other maternal infectious and parasitic diseases complicating pregnancy, third trimester: Secondary | ICD-10-CM | POA: Insufficient documentation

## 2021-05-05 DIAGNOSIS — Z362 Encounter for other antenatal screening follow-up: Secondary | ICD-10-CM

## 2021-05-07 LAB — CULTURE, BETA STREP (GROUP B ONLY): Strep Gp B Culture: NEGATIVE

## 2021-05-10 ENCOUNTER — Ambulatory Visit (INDEPENDENT_AMBULATORY_CARE_PROVIDER_SITE_OTHER): Payer: Medicaid Other | Admitting: Obstetrics

## 2021-05-10 ENCOUNTER — Encounter: Payer: Self-pay | Admitting: Obstetrics

## 2021-05-10 VITALS — BP 116/72 | HR 87 | Wt 219.0 lb

## 2021-05-10 DIAGNOSIS — O98813 Other maternal infectious and parasitic diseases complicating pregnancy, third trimester: Secondary | ICD-10-CM

## 2021-05-10 DIAGNOSIS — A749 Chlamydial infection, unspecified: Secondary | ICD-10-CM

## 2021-05-10 DIAGNOSIS — Z3480 Encounter for supervision of other normal pregnancy, unspecified trimester: Secondary | ICD-10-CM

## 2021-05-10 DIAGNOSIS — Z789 Other specified health status: Secondary | ICD-10-CM

## 2021-05-10 NOTE — Progress Notes (Signed)
Subjective:  ?Alison Martin is a 26 y.o. G2P1001 at [redacted]w[redacted]d being seen today for ongoing prenatal care.  She is currently monitored for the following issues for this low-risk pregnancy and has Supervision of other normal pregnancy, antepartum; History of cholecystitis; and Chlamydia infection affecting pregnancy in third trimester on their problem list. ? ?Patient reports no complaints.  Contractions: Not present. Vag. Bleeding: None.  Movement: Present. Denies leaking of fluid.  ? ?The following portions of the patient's history were reviewed and updated as appropriate: allergies, current medications, past family history, past medical history, past social history, past surgical history and problem list. Problem list updated. ? ?Objective:  ? ?Vitals:  ? 05/10/21 1330  ?BP: 116/72  ?Pulse: 87  ?Weight: 219 lb (99.3 kg)  ? ? ?Fetal Status: Fetal Heart Rate (bpm): 148   Movement: Present    ? ?General:  Alert, oriented and cooperative. Patient is in no acute distress.  ?Skin: Skin is warm and dry. No rash noted.   ?Cardiovascular: Normal heart rate noted  ?Respiratory: Normal respiratory effort, no problems with respiration noted  ?Abdomen: Soft, gravid, appropriate for gestational age. Pain/Pressure: Present     ?Pelvic:  Cervical exam deferred        ?Extremities: Normal range of motion.  Edema: Trace  ?Mental Status: Normal mood and affect. Normal behavior. Normal judgment and thought content.  ? ?Urinalysis:     ? ?Assessment and Plan:  ?Pregnancy: G2P1001 at [redacted]w[redacted]d ? ?1. Supervision of other normal pregnancy, antepartum ? ?2. Chlamydia infection affecting pregnancy in third trimester, treated, negative TOC on 05-03-2021 ? ?3. Language barrier affecting health care ?- interpreter present ? ?Term labor symptoms and general obstetric precautions including but not limited to vaginal bleeding, contractions, leaking of fluid and fetal movement were reviewed in detail with the patient. ?Please refer to After Visit Summary  for other counseling recommendations.  ? ?Return in about 1 week (around 05/17/2021) for ROB. ? ? ?Shelly Bombard, MD  ?05/10/21  ?

## 2021-05-19 ENCOUNTER — Encounter: Payer: Self-pay | Admitting: Obstetrics

## 2021-05-19 ENCOUNTER — Ambulatory Visit (INDEPENDENT_AMBULATORY_CARE_PROVIDER_SITE_OTHER): Payer: Medicaid Other | Admitting: Obstetrics

## 2021-05-19 VITALS — BP 131/79 | HR 97 | Wt 223.0 lb

## 2021-05-19 DIAGNOSIS — Z789 Other specified health status: Secondary | ICD-10-CM

## 2021-05-19 DIAGNOSIS — Z3480 Encounter for supervision of other normal pregnancy, unspecified trimester: Secondary | ICD-10-CM

## 2021-05-19 DIAGNOSIS — O98813 Other maternal infectious and parasitic diseases complicating pregnancy, third trimester: Secondary | ICD-10-CM

## 2021-05-19 DIAGNOSIS — A749 Chlamydial infection, unspecified: Secondary | ICD-10-CM

## 2021-05-19 NOTE — Progress Notes (Signed)
Subjective:  ?Alison Martin is a 26 y.o. G2P1001 at [redacted]w[redacted]d being seen today for ongoing prenatal care.  She is currently monitored for the following issues for this low-risk pregnancy and has Supervision of other normal pregnancy, antepartum; History of cholecystitis; and Chlamydia infection affecting pregnancy in third trimester on their problem list. ? ?Patient reports backache.  Contractions: Not present. Vag. Bleeding: None.  Movement: Present. Denies leaking of fluid.  ? ?The following portions of the patient's history were reviewed and updated as appropriate: allergies, current medications, past family history, past medical history, past social history, past surgical history and problem list. Problem list updated. ? ?Objective:  ? ?Vitals:  ? 05/19/21 1130  ?BP: 131/79  ?Pulse: 97  ?Weight: 223 lb (101.2 kg)  ? ? ?Fetal Status: Fetal Heart Rate (bpm): 143   Movement: Present    ? ?General:  Alert, oriented and cooperative. Patient is in no acute distress.  ?Skin: Skin is warm and dry. No rash noted.   ?Cardiovascular: Normal heart rate noted  ?Respiratory: Normal respiratory effort, no problems with respiration noted  ?Abdomen: Soft, gravid, appropriate for gestational age. Pain/Pressure: Present     ?Pelvic:  Cervical exam deferred        ?Extremities: Normal range of motion.     ?Mental Status: Normal mood and affect. Normal behavior. Normal judgment and thought content.  ? ?Urinalysis:     ? ?Assessment and Plan:  ?Pregnancy: G2P1001 at [redacted]w[redacted]d ? ?1. Supervision of other normal pregnancy, antepartum ? ?2. Language barrier affecting health care ? ?3. Chlamydia infection affecting pregnancy in third trimester, ttreated ?- TOC negative on 05-03-21 ? ? ?Term labor symptoms and general obstetric precautions including but not limited to vaginal bleeding, contractions, leaking of fluid and fetal movement were reviewed in detail with the patient. ?Please refer to After Visit Summary for other counseling  recommendations.  ? ?Return in about 1 week (around 05/26/2021) for ROB. ? ? ?Brock Bad, MD  ?05/19/21  ?

## 2021-05-23 NOTE — Progress Notes (Signed)
? ?  PRENATAL VISIT NOTE ? ?Subjective:  ?Alison Martin is a 26 y.o. G2P1001 at [redacted]w[redacted]d being seen today for ongoing prenatal care.  She is currently monitored for the following issues for this low-risk pregnancy and has Supervision of other normal pregnancy, antepartum; History of cholecystitis; and Chlamydia infection affecting pregnancy in third trimester on their problem list. ? ?Patient reports no complaints.  Contractions: Irritability. Vag. Bleeding: None.  Movement: Present. Denies leaking of fluid.  ? ?The following portions of the patient's history were reviewed and updated as appropriate: allergies, current medications, past family history, past medical history, past social history, past surgical history and problem list.  ? ?Objective:  ? ?Vitals:  ? 05/25/21 1045  ?BP: 119/74  ?Pulse: 84  ?Weight: 224 lb 3.2 oz (101.7 kg)  ? ? ?Fetal Status: Fetal Heart Rate (bpm): 155 Fundal Height: 41 cm Movement: Present    ? ?General:  Alert, oriented and cooperative. Patient is in no acute distress.  ?Skin: Skin is warm and dry. No rash noted.   ?Cardiovascular: Normal heart rate noted  ?Respiratory: Normal respiratory effort, no problems with respiration noted  ?Abdomen: Soft, gravid, appropriate for gestational age.  Pain/Pressure: Present     ?Pelvic: Cervical exam deferred        ?Extremities: Normal range of motion.  Edema: Trace  ?Mental Status: Normal mood and affect. Normal behavior. Normal judgment and thought content.  ? ?Assessment and Plan:  ?Pregnancy: G2P1001 at [redacted]w[redacted]d ? ?1. [redacted] weeks gestation of pregnancy   ?2. Supervision of other normal pregnancy, antepartum   ? ?-does not want birth control pills, is thinking about nexplanon  ?-strict labor precuations given, reviewed fetal movements and how to count ?-will schedule IOL; patient knows she will be called that day ?-[atient declined cervical exam ?-discussed IOL at 41 weeks; patient has been induced before and remembers the process.  ?Preterm labor  symptoms and general obstetric precautions including but not limited to vaginal bleeding, contractions, leaking of fluid and fetal movement were reviewed in detail with the patient. ?Please refer to After Visit Summary for other counseling recommendations.  ? ?Return in about 8 days (around 06/02/2021). ? ?Future Appointments  ?Date Time Provider [place]  ?06/02/2021 10:00 AM [facility] NURSE [facility] None  ? ? ? ?Starr Lake, CNM ? ?

## 2021-05-25 ENCOUNTER — Ambulatory Visit (INDEPENDENT_AMBULATORY_CARE_PROVIDER_SITE_OTHER): Payer: Medicaid Other | Admitting: Student

## 2021-05-25 ENCOUNTER — Other Ambulatory Visit: Payer: Self-pay | Admitting: Student

## 2021-05-25 VITALS — BP 119/74 | HR 84 | Wt 224.2 lb

## 2021-05-25 DIAGNOSIS — Z3483 Encounter for supervision of other normal pregnancy, third trimester: Secondary | ICD-10-CM

## 2021-05-25 DIAGNOSIS — Z3A38 38 weeks gestation of pregnancy: Secondary | ICD-10-CM

## 2021-05-25 DIAGNOSIS — Z3480 Encounter for supervision of other normal pregnancy, unspecified trimester: Secondary | ICD-10-CM

## 2021-05-25 DIAGNOSIS — Z3A39 39 weeks gestation of pregnancy: Secondary | ICD-10-CM | POA: Diagnosis not present

## 2021-05-26 ENCOUNTER — Telehealth (HOSPITAL_COMMUNITY): Payer: Self-pay | Admitting: *Deleted

## 2021-05-26 NOTE — Telephone Encounter (Signed)
Preadmission screenPreadmission screen 

## 2021-05-29 ENCOUNTER — Telehealth (HOSPITAL_COMMUNITY): Payer: Self-pay | Admitting: *Deleted

## 2021-05-29 NOTE — Telephone Encounter (Signed)
Preadmission screen  

## 2021-05-31 ENCOUNTER — Telehealth (HOSPITAL_COMMUNITY): Payer: Self-pay | Admitting: *Deleted

## 2021-05-31 NOTE — Telephone Encounter (Signed)
[id number] ?Interpreter number  ?Preadmission screen  ?

## 2021-06-01 ENCOUNTER — Other Ambulatory Visit: Payer: Self-pay | Admitting: Advanced Practice Midwife

## 2021-06-01 ENCOUNTER — Telehealth: Payer: Self-pay

## 2021-06-01 NOTE — Telephone Encounter (Signed)
Called with spanish interpreter to see if pt can come in earlier for appt tomorrow. No answer, left vm

## 2021-06-02 ENCOUNTER — Ambulatory Visit (INDEPENDENT_AMBULATORY_CARE_PROVIDER_SITE_OTHER): Payer: Medicaid Other

## 2021-06-02 VITALS — BP 115/76 | HR 91 | Wt 228.1 lb

## 2021-06-02 DIAGNOSIS — O98813 Other maternal infectious and parasitic diseases complicating pregnancy, third trimester: Secondary | ICD-10-CM | POA: Diagnosis not present

## 2021-06-02 DIAGNOSIS — A749 Chlamydial infection, unspecified: Secondary | ICD-10-CM | POA: Diagnosis not present

## 2021-06-02 DIAGNOSIS — O480 Post-term pregnancy: Secondary | ICD-10-CM

## 2021-06-02 NOTE — Progress Notes (Cosign Needed)
NST and AFI performed today for post dates pregnancy at [redacted]w[redacted]d.  AFI: 14.2

## 2021-06-02 NOTE — Telephone Encounter (Signed)
Preadmission screen  Interpreter number [phone]

## 2021-06-02 NOTE — Progress Notes (Signed)
Agree with nurses's documentation of this patient's clinic encounter.  Wesly Whisenant L, MD  

## 2021-06-02 NOTE — Progress Notes (Signed)
Pt reports fetal movement with some pressure. 

## 2021-06-05 ENCOUNTER — Telehealth (HOSPITAL_COMMUNITY): Payer: Self-pay | Admitting: *Deleted

## 2021-06-05 NOTE — Telephone Encounter (Signed)
Preadmission screen  

## 2021-06-07 ENCOUNTER — Encounter (HOSPITAL_COMMUNITY): Payer: Self-pay | Admitting: Student

## 2021-06-07 ENCOUNTER — Inpatient Hospital Stay (HOSPITAL_COMMUNITY)
Admission: AD | Admit: 2021-06-07 | Discharge: 2021-06-09 | DRG: 807 | Disposition: A | Discharge status: Home / Self Care | Payer: Medicaid Other | Attending: Family Medicine | Admitting: Family Medicine

## 2021-06-07 ENCOUNTER — Inpatient Hospital Stay (HOSPITAL_COMMUNITY): Payer: Medicaid Other

## 2021-06-07 DIAGNOSIS — O0930 Supervision of pregnancy with insufficient antenatal care, unspecified trimester: Secondary | ICD-10-CM

## 2021-06-07 DIAGNOSIS — O480 Post-term pregnancy: Secondary | ICD-10-CM | POA: Diagnosis present

## 2021-06-07 DIAGNOSIS — Z23 Encounter for immunization: Secondary | ICD-10-CM

## 2021-06-07 DIAGNOSIS — Z87891 Personal history of nicotine dependence: Secondary | ICD-10-CM | POA: Diagnosis not present

## 2021-06-07 DIAGNOSIS — Z3A41 41 weeks gestation of pregnancy: Secondary | ICD-10-CM

## 2021-06-07 DIAGNOSIS — Z8719 Personal history of other diseases of the digestive system: Secondary | ICD-10-CM

## 2021-06-07 DIAGNOSIS — A749 Chlamydial infection, unspecified: Secondary | ICD-10-CM | POA: Diagnosis present

## 2021-06-07 DIAGNOSIS — O98813 Other maternal infectious and parasitic diseases complicating pregnancy, third trimester: Secondary | ICD-10-CM | POA: Diagnosis present

## 2021-06-07 DIAGNOSIS — Z3480 Encounter for supervision of other normal pregnancy, unspecified trimester: Secondary | ICD-10-CM

## 2021-06-07 HISTORY — DX: Supervision of pregnancy with insufficient antenatal care, unspecified trimester: O09.30

## 2021-06-07 LAB — CBC
HCT: 32 % — ABNORMAL LOW (ref 36.0–46.0)
Hemoglobin: 10 g/dL — ABNORMAL LOW (ref 12.0–15.0)
MCH: 23.5 pg — ABNORMAL LOW (ref 26.0–34.0)
MCHC: 31.3 g/dL (ref 30.0–36.0)
MCV: 75.3 fL — ABNORMAL LOW (ref 80.0–100.0)
Platelets: 286 10*3/uL (ref 150–400)
RBC: 4.25 MIL/uL (ref 3.87–5.11)
RDW: 15.8 % — ABNORMAL HIGH (ref 11.5–15.5)
WBC: 10.5 10*3/uL (ref 4.0–10.5)
nRBC: 0 % (ref 0.0–0.2)

## 2021-06-07 LAB — TYPE AND SCREEN
ABO/RH(D): O POS
Antibody Screen: NEGATIVE

## 2021-06-07 MED ORDER — TERBUTALINE SULFATE 1 MG/ML IJ SOLN: 0.2500 mg | Freq: Once | INTRAMUSCULAR | Status: DC | PRN

## 2021-06-07 MED ORDER — LACTATED RINGERS IV SOLN
500.0000 mL | INTRAVENOUS | Status: DC | PRN
  Administered 2021-06-08: 500 mL via INTRAVENOUS

## 2021-06-07 MED ORDER — ACETAMINOPHEN 325 MG PO TABS: 650.0000 mg | ORAL_TABLET | ORAL | Status: DC | PRN

## 2021-06-07 MED ORDER — OXYTOCIN BOLUS FROM INFUSION
333.0000 mL | Freq: Once | INTRAVENOUS | Status: AC
  Administered 2021-06-08: 333 mL via INTRAVENOUS

## 2021-06-07 MED ORDER — OXYTOCIN-SODIUM CHLORIDE 30-0.9 UT/500ML-% IV SOLN
2.5000 [IU]/h | INTRAVENOUS | Status: DC
  Administered 2021-06-08: 2.5 [IU]/h via INTRAVENOUS
  Filled 2021-06-07: qty 500

## 2021-06-07 MED ORDER — ONDANSETRON HCL 4 MG/2ML IJ SOLN
4.0000 mg | Freq: Four times a day (QID) | INTRAMUSCULAR | Status: DC | PRN
Start: 2021-06-07 — End: 2021-06-08

## 2021-06-07 MED ORDER — OXYCODONE-ACETAMINOPHEN 5-325 MG PO TABS: 1.0000 | ORAL_TABLET | ORAL | Status: DC | PRN

## 2021-06-07 MED ORDER — LACTATED RINGERS IV SOLN: INTRAVENOUS | Status: DC

## 2021-06-07 MED ORDER — MISOPROSTOL 25 MCG QUARTER TABLET
25.0000 ug | ORAL_TABLET | ORAL | Status: DC | PRN
Start: 2021-06-07 — End: 2021-06-08
  Administered 2021-06-07: 25 ug via VAGINAL
  Filled 2021-06-07 (×2): qty 1

## 2021-06-07 MED ORDER — OXYCODONE-ACETAMINOPHEN 5-325 MG PO TABS: 2.0000 | ORAL_TABLET | ORAL | Status: DC | PRN

## 2021-06-07 MED ORDER — SOD CITRATE-CITRIC ACID 500-334 MG/5ML PO SOLN: 30.0000 mL | ORAL | Status: DC | PRN

## 2021-06-07 MED ORDER — LIDOCAINE HCL (PF) 1 % IJ SOLN: 30.0000 mL | INTRAMUSCULAR | Status: DC | PRN

## 2021-06-07 NOTE — H&P (Signed)
Alison Martin is a 26 y.o. G51P1001 female at [redacted]w[redacted]d by LMP c/w 32wk scan presenting for postdates IOL.   Reports active fetal movement, contractions: irreg, mild, vaginal bleeding: none, membranes: intact.  Initiated prenatal care at [facility] (later tx to [facility]) at 29 wks.   Most recent u/s : 36.2wk EFW 59%, AFI 11.9cm, cephalic, posterior fundal placenta.   This pregnancy complicated by: # limited care with first visit @ 29wks # 3rd trimester chlamydia with neg TOC # hx of cholecystitis (controlling w dietary mod)  Prenatal History/Complications:  # term vag del of 4400gm infant (no difficulty w delivery)  Past Medical History: Past Medical History:  Diagnosis Date   Chlamydia trachomatis infection in pregnancy in third trimester 10/15/2019   + 05/2019 + 07/08/19 (had not picked up medication)  Confirmed treatment in early August TOC 10/15/19 + Retreated, negative TOC on 10/27   Medical history non-contributory     Past Surgical History: Past Surgical History:  Procedure Laterality Date   NO PAST SURGERIES      Obstetrical History: OB History     Gravida  2   Para  1   Term  1   Preterm      AB      Living  1      SAB      IAB      Ectopic      Multiple  0   Live Births  1           Social History: Social History   Socioeconomic History   Marital status: Single    Spouse name: Not on file   Number of children: Not on file   Years of education: Not on file   Highest education level: 9th grade  Occupational History   Not on file  Tobacco Use   Smoking status: Former   Smokeless tobacco: Never   Tobacco comments:    stopped about year ago  Vaping Use   Vaping Use: Never used  Substance and Sexual Activity   Alcohol use: Not Currently    Comment: socially, none in the last year   Drug use: Never   Sexual activity: Yes    Birth control/protection: None  Other Topics Concern   Not on file  Social History Narrative   Not on file    Social Determinants of Health   Financial Resource Strain: Not on file  Food Insecurity: Not on file  Transportation Needs: Not on file  Physical Activity: Not on file  Stress: Not on file  Social Connections: Not on file    Family History: Family History  Problem Relation Age of Onset   Hypertension Mother     Allergies: No Known Allergies  Medications Prior to Admission  Medication Sig Dispense Refill Last Dose   acetaminophen (TYLENOL) 325 MG tablet Take 2 tablets (650 mg total) by mouth every 6 (six) hours. (Patient not taking: Reported on 03/15/2021)      amoxicillin-clavulanate (AUGMENTIN) 875-125 MG tablet Take 1 tablet by mouth every 12 (twelve) hours. (Patient not taking: Reported on 03/15/2021) 10 tablet 0    azithromycin (ZITHROMAX) 500 MG tablet Take 1,000 mg by mouth once. (Patient not taking: Reported on 05/10/2021)      coconut oil OIL Apply 1 application topically as needed. (Patient not taking: Reported on 03/15/2021)  0    dibucaine (NUPERCAINAL) 1 % OINT Place 1 application rectally as needed for hemorrhoids. (Patient not taking: Reported on 12/24/2019)  famotidine (PEPCID) 20 MG tablet Take 1 tablet (20 mg total) by mouth 2 (two) times daily. (Patient not taking: Reported on 05/25/2021) 60 tablet 2    ibuprofen (ADVIL) 600 MG tablet Take 1 tablet (600 mg total) by mouth every 6 (six) hours. (Patient not taking: Reported on 12/24/2019) 30 tablet 0    metroNIDAZOLE (METROGEL) 0.75 % vaginal gel Place 1 Applicatorful vaginally at bedtime. Apply one applicatorful to vagina at bedtime for 5 days (Patient not taking: Reported on 04/12/2021) 70 g 1    ondansetron (ZOFRAN ODT) 8 MG disintegrating tablet Take 1 tablet (8 mg total) by mouth every 8 (eight) hours as needed for nausea or vomiting. (Patient not taking: Reported on 03/15/2021) 20 tablet 0    polyethylene glycol (MIRALAX) 17 g packet Take 17 g by mouth daily as needed for moderate constipation. (Patient not taking:  Reported on 03/15/2021) 4 each 0    Prenatal Vit-Fe Fumarate-FA (PRENATAL PLUS VITAMIN/MINERAL) 27-1 MG TABS Take 1 tablet by mouth daily after breakfast. 30 tablet 5     Review of Systems  Pertinent pos/neg as indicated in HPI  Last menstrual period 08/24/2020, not currently breastfeeding. General appearance: alert, cooperative, and no distress Lungs: clear to auscultation bilaterally Heart: regular rate and rhythm Abdomen: gravid, soft, non-tender, EFW by Leopold's approximately 8lbs Extremities: tr edema DTR's nl  Fetal monitoring: FHR: 135-140 bpm, variability: moderate,  Accelerations: Present,  decelerations:  Absent Uterine activity: irreg, mild   Presentation: cephalic   Prenatal labs: ABO, Rh: O/Positive/-- (03/01 1436) Antibody: Negative (03/01 1436) Rubella: 1.92 (03/01 1436) RPR: Non Reactive (03/08 0920)  HBsAg: Negative (03/01 1436)  HIV: Non Reactive (03/08 0920)  GBS: Negative/-- (04/19 1439)  2hr GTT: 83/176/138  Prenatal Transfer Tool  Maternal Diabetes: No Genetic Screening: Declined Maternal Ultrasounds/Referrals: Normal Fetal Ultrasounds or other Referrals:  None Maternal Substance Abuse:  No Significant Maternal Medications:  None Significant Maternal Lab Results: Group B Strep negative  No results found for this or any previous visit (from the past 24 hour(s)).   Assessment:  [redacted]w[redacted]d SIUP  G2P1001  Cx unfavorable  Cat 1 FHR  GBS Negative/-- (04/19 1439)  Plan:  Admit to L&D  IV pain meds/epidural prn active labor  Plan for cx ripening w cytotec to start, and then progress to either cervial foley or Pit  Anticipate vag del   Plans to breastfeed  Contraception: probably Nexplanon  Circumcision: no  AILEN STRAUCH CNM 06/07/2021, 10:10 PM

## 2021-06-08 ENCOUNTER — Encounter (HOSPITAL_COMMUNITY): Payer: Self-pay | Admitting: Student

## 2021-06-08 DIAGNOSIS — Z3A41 41 weeks gestation of pregnancy: Secondary | ICD-10-CM

## 2021-06-08 DIAGNOSIS — O480 Post-term pregnancy: Secondary | ICD-10-CM

## 2021-06-08 LAB — RPR: RPR Ser Ql: NONREACTIVE

## 2021-06-08 MED ORDER — TETANUS-DIPHTH-ACELL PERTUSSIS 5-2.5-18.5 LF-MCG/0.5 IM SUSY
0.5000 mL | PREFILLED_SYRINGE | Freq: Once | INTRAMUSCULAR | Status: AC
  Administered 2021-06-09: 0.5 mL via INTRAMUSCULAR
  Filled 2021-06-08: qty 0.5

## 2021-06-08 MED ORDER — LACTATED RINGERS IV SOLN: 500.0000 mL | Freq: Once | INTRAVENOUS | Status: DC

## 2021-06-08 MED ORDER — FENTANYL-BUPIVACAINE-NACL 0.5-0.125-0.9 MG/250ML-% EP SOLN: 12.0000 mL/h | EPIDURAL | Status: DC | PRN

## 2021-06-08 MED ORDER — PHENYLEPHRINE 80 MCG/ML (10ML) SYRINGE FOR IV PUSH (FOR BLOOD PRESSURE SUPPORT): 80.0000 ug | PREFILLED_SYRINGE | INTRAVENOUS | Status: DC | PRN

## 2021-06-08 MED ORDER — COCONUT OIL OIL: 1.0000 "application " | TOPICAL_OIL | Status: DC | PRN

## 2021-06-08 MED ORDER — ONDANSETRON HCL 4 MG/2ML IJ SOLN: 4.0000 mg | INTRAMUSCULAR | Status: DC | PRN

## 2021-06-08 MED ORDER — IBUPROFEN 600 MG PO TABS
600.0000 mg | ORAL_TABLET | Freq: Four times a day (QID) | ORAL | Status: DC
  Administered 2021-06-08: 600 mg via ORAL
  Filled 2021-06-08 (×2): qty 1

## 2021-06-08 MED ORDER — ONDANSETRON HCL 4 MG PO TABS: 4.0000 mg | ORAL_TABLET | ORAL | Status: DC | PRN

## 2021-06-08 MED ORDER — PRENATAL MULTIVITAMIN CH
1.0000 | ORAL_TABLET | Freq: Every day | ORAL | Status: DC
  Administered 2021-06-08 – 2021-06-09 (×2): 1 via ORAL
  Filled 2021-06-08 (×2): qty 1

## 2021-06-08 MED ORDER — SIMETHICONE 80 MG PO CHEW: 80.0000 mg | CHEWABLE_TABLET | ORAL | Status: DC | PRN

## 2021-06-08 MED ORDER — DIBUCAINE (PERIANAL) 1 % EX OINT: 1.0000 "application " | TOPICAL_OINTMENT | CUTANEOUS | Status: DC | PRN

## 2021-06-08 MED ORDER — SENNOSIDES-DOCUSATE SODIUM 8.6-50 MG PO TABS
2.0000 | ORAL_TABLET | ORAL | Status: DC
  Administered 2021-06-08 – 2021-06-09 (×2): 2 via ORAL
  Filled 2021-06-08 (×2): qty 2

## 2021-06-08 MED ORDER — DIPHENHYDRAMINE HCL 50 MG/ML IJ SOLN: 12.5000 mg | INTRAMUSCULAR | Status: DC | PRN

## 2021-06-08 MED ORDER — EPHEDRINE 5 MG/ML INJ: 10.0000 mg | INTRAVENOUS | Status: DC | PRN

## 2021-06-08 MED ORDER — MEASLES, MUMPS & RUBELLA VAC IJ SOLR: 0.5000 mL | Freq: Once | INTRAMUSCULAR | Status: DC

## 2021-06-08 MED ORDER — BENZOCAINE-MENTHOL 20-0.5 % EX AERO: 1.0000 "application " | INHALATION_SPRAY | CUTANEOUS | Status: DC | PRN

## 2021-06-08 MED ORDER — FENTANYL CITRATE (PF) 100 MCG/2ML IJ SOLN
100.0000 ug | INTRAMUSCULAR | Status: DC | PRN
  Administered 2021-06-08: 100 ug via INTRAVENOUS
  Filled 2021-06-08: qty 2

## 2021-06-08 MED ORDER — WITCH HAZEL-GLYCERIN EX PADS: 1.0000 "application " | MEDICATED_PAD | CUTANEOUS | Status: DC | PRN

## 2021-06-08 MED ORDER — OXYTOCIN-SODIUM CHLORIDE 30-0.9 UT/500ML-% IV SOLN
1.0000 m[IU]/min | INTRAVENOUS | Status: DC
  Administered 2021-06-08: 2 m[IU]/min via INTRAVENOUS

## 2021-06-08 MED ORDER — ZOLPIDEM TARTRATE 5 MG PO TABS: 5.0000 mg | ORAL_TABLET | Freq: Every evening | ORAL | Status: DC | PRN

## 2021-06-08 MED ORDER — DIPHENHYDRAMINE HCL 25 MG PO CAPS: 25.0000 mg | ORAL_CAPSULE | Freq: Four times a day (QID) | ORAL | Status: DC | PRN

## 2021-06-08 MED ORDER — IBUPROFEN 600 MG PO TABS
600.0000 mg | ORAL_TABLET | Freq: Four times a day (QID) | ORAL | Status: DC
  Administered 2021-06-08 – 2021-06-09 (×6): 600 mg via ORAL
  Filled 2021-06-08 (×5): qty 1

## 2021-06-08 MED ORDER — OXYCODONE HCL 5 MG PO TABS: 5.0000 mg | ORAL_TABLET | ORAL | Status: DC | PRN

## 2021-06-08 MED ORDER — ACETAMINOPHEN 325 MG PO TABS: 650.0000 mg | ORAL_TABLET | ORAL | Status: DC | PRN

## 2021-06-08 NOTE — Plan of Care (Signed)
  Problem: Education: Goal: Knowledge of condition will improve Note: Admission education, safety and unit protocols reviewed with patient using Okey Regal, in house interpreter. Earl Gala, Linda Hedges Stewart

## 2021-06-08 NOTE — Lactation Note (Addendum)
This note was copied from a baby's chart. Lactation Consultation Note  Patient Name: Alison Martin [id number] Date: 06/08/2021 Reason for consult: Initial assessment;Breastfeeding assistance;Term Age:26 hours  Interpreter used #[id number] Marcela  LC entered the room and mom was feeding baby on her left breast. Per mom she breast fed for 2 months last time and is not sure why she stopped. Mom stated that the latch was comfortable. LC rotated baby more towards mom. LC showed mom how to hand express on her right breast and milk was noted. Mom is aware that we are watching baby's feeding cues.   LC shared the Spanish Lactation services brochure with the parents.   Mom will call for assistance with latch if needed.   Current Feeding Plan:  Mom will feed according to feeding cues 8+ times in 24 hours.  Mom will hand express for added stimulation and give any milk that she gets to baby.  Mom will call for latch assistance if needed.   Maternal Data Has patient been taught Hand Expression?: Yes Does the patient have breastfeeding experience prior to this delivery?: Yes How long did the patient breastfeed?: She breastfed for 2 months and isn't sure why she stopped.  Feeding Mother's Current Feeding Choice: Breast Milk  LATCH Score Latch: Grasps breast easily, tongue down, lips flanged, rhythmical sucking.  Audible Swallowing: Spontaneous and intermittent  Type of Nipple: Everted at rest and after stimulation  Comfort (Breast/Nipple): Soft / non-tender  Hold (Positioning): Assistance needed to correctly position infant at breast and maintain latch.  LATCH Score: 9   Lactation Tools Discussed/Used    Interventions Interventions: Adjust position;Education;Hand express  Discharge    Consult Status Consult Status: Follow-up Date: 06/09/21 Follow-up type: In-patient    Orvil Feil Laurrie Toppin 06/08/2021, 2:12 PM

## 2021-06-08 NOTE — Progress Notes (Signed)
Patient ID: Alison Martin, female   DOB: 1995/04/18, 26 y.o.   MRN: [id number]  Breathing w ctx- doing well without epidural/meds  FHR 130s, +accels, occ variables Ctx q 1-3 mins with Pit at 6 mu/min Cx was 8/90/0 per RN exam at 0545  IUP@41 .1wks Active labor/transition  Anticipate vag delivery  DANILYN COCKE [facility] 06/08/2021 6:26 AM

## 2021-06-08 NOTE — Discharge Summary (Signed)
Postpartum Discharge Summary  Date of Service updated     Patient Name: Alison Martin DOB: 02/22/1995 MRN: [id number]  Date of admission: 06/07/2021 Delivery date:06/08/2021  Delivering provider: Serita Grammes Martin  Date of discharge: 06/09/2021  Admitting diagnosis: Indication for care in labor and delivery, antepartum [O75.9] Intrauterine pregnancy: [redacted]w[redacted]Martin     Secondary diagnosis:  Principal Problem:   Indication for care in labor and delivery, antepartum Active Problems:   History of cholecystitis   Chlamydia infection affecting pregnancy in third trimester   Limited prenatal care  Additional problems: none    Discharge diagnosis: Term Pregnancy Delivered                                              Post partum procedures: None Augmentation: Pitocin and Cytotec Complications: None  Hospital course: Induction of Labor With Vaginal Delivery   26 y.o. yo G2P1001 at [redacted]w[redacted]Martin was admitted to the hospital 06/07/2021 for induction of labor.  Indication for induction: Postdates.  Patient had an uncomplicated labor course as follows: Membrane Rupture Time/Date: 6:43 AM ,06/08/2021   Delivery Method:Vaginal, Spontaneous  Episiotomy: None  Lacerations:  None  Details of delivery can be found in separate delivery note.  Patient had a routine postpartum course. Patient is discharged home 06/09/21.  Newborn Data: Birth date:06/08/2021  Birth time:7:10 AM  Gender:Female  Living status:Living  Apgars:8 ,9  Weight:4110 g (9lb 1oz)  Magnesium Sulfate received: No BMZ received: No Rhophylac:N/A MMR:N/A T-DaP: declined prenatally Flu: No Transfusion:No  Physical exam  Vitals:   06/08/21 1330 06/08/21 1801 06/08/21 2120 06/09/21 0524  BP: 112/64 126/67 117/80 101/63  Pulse: 95 60 74 79  Resp: $Remo'18 18 18 18  'DLthe$ Temp: 99 F (37.2 C) 98.6 F (37 C) 99 F (37.2 C) 98.8 F (37.1 C)  TempSrc: Oral Oral Oral Oral  SpO2: 99% 99% 99% 99%  Weight:      Height:       General: alert,  cooperative, and no distress Lochia: appropriate Uterine Fundus: firm Incision: N/A DVT Evaluation: no significant LE edema  Labs: Lab Results  Component Value Date   WBC 14.1 (H) 06/09/2021   HGB 8.4 (L) 06/09/2021   HCT 25.9 (L) 06/09/2021   MCV 74.6 (L) 06/09/2021   PLT 242 06/09/2021      Latest Ref Rng & Units 03/15/2021    2:36 PM  CMP  Glucose 70 - 99 mg/dL 70    BUN 6 - 20 mg/dL 8    Creatinine 0.57 - 1.00 mg/dL 0.45    Sodium 134 - 144 mmol/L 138    Potassium 3.5 - 5.2 mmol/L 3.7    Chloride 96 - 106 mmol/L 102    CO2 20 - 29 mmol/L 21    Calcium 8.7 - 10.2 mg/dL 9.8    Total Protein 6.0 - 8.5 g/dL 6.7    Total Bilirubin 0.0 - 1.2 mg/dL 0.4    Alkaline Phos 44 - 121 IU/L 79    AST 0 - 40 IU/L 12    ALT 0 - 32 IU/L 8     Edinburgh Score:    06/08/2021    5:55 PM  Edinburgh Postnatal Depression Scale Screening Tool  I have been able to laugh and see the funny side of things. 1  I have looked forward with enjoyment to things. 1  I have blamed myself unnecessarily when things went wrong. 0  I have been anxious or worried for no good reason. 2  I have felt scared or panicky for no good reason. 0  Things have been getting on top of me. 0  I have been so unhappy that I have had difficulty sleeping. 0  I have felt sad or miserable. 1  I have been so unhappy that I have been crying. 0  The thought of harming myself has occurred to me. 0  Edinburgh Postnatal Depression Scale Total 5     After visit meds:  Allergies as of 06/09/2021   No Known Allergies      Medication List     STOP taking these medications    amoxicillin-clavulanate 875-125 MG tablet Commonly known as: AUGMENTIN   azithromycin 500 MG tablet Commonly known as: ZITHROMAX   coconut oil Oil   dibucaine 1 % Oint Commonly known as: NUPERCAINAL   famotidine 20 MG tablet Commonly known as: Pepcid   metroNIDAZOLE 0.75 % vaginal gel Commonly known as: METROGEL   ondansetron 8 MG  disintegrating tablet Commonly known as: Zofran ODT   polyethylene glycol 17 g packet Commonly known as: MiraLax   Prenatal Plus Vitamin/Mineral 27-1 MG Tabs       TAKE these medications    acetaminophen 325 MG tablet Commonly known as: Tylenol Take 2 tablets (650 mg total) by mouth every 4 (four) hours as needed (for pain scale < 4). What changed:  when to take this reasons to take this   FeroSul 325 (65 FE) MG tablet Generic drug: ferrous sulfate Take 1 tablet (325 mg total) by mouth every other day.   ibuprofen 600 MG tablet Commonly known as: ADVIL Take 1 tablet (600 mg total) by mouth every 6 (six) hours as needed. What changed:  when to take this reasons to take this         Discharge home in stable condition Infant Feeding: Breast Infant Disposition:home with mother Discharge instruction: per After Visit Summary and Postpartum booklet. Activity: Advance as tolerated. Pelvic rest for 6 weeks.  Diet: routine diet Future Appointments: Future Appointments  Date Time Provider [place]  07/06/2021 10:55 AM Alison Bombard, MD [place] None   Follow up Visit:  Alison Martin, CNM  P [facility] Please schedule this patient for Postpartum visit in: 4 weeks with the following provider: Any provider  In-Person  For C/S patients schedule nurse incision check in weeks 2 weeks: no  Low risk pregnancy complicated by: postdates  Delivery mode:  SVD  Anticipated Birth Control:  thinking about Nexplanon  PP Procedures needed: possibly needs Pap?  Schedule Integrated BH visit: no   06/09/2021 Alison Clan, DO

## 2021-06-08 NOTE — Lactation Note (Signed)
This note was copied from a baby's chart. Lactation Consultation Note  Patient Name: [name] WUJWJ'X Date: 06/08/2021   Age:26 hours  LC attempted to visit mom, but she was in the bathroom. LC will follow-up at a later time.   Maternal Data    Feeding    LATCH Score                    Lactation Tools Discussed/Used    Interventions    Discharge    Consult Status      Alison Martin 06/08/2021, 1:39 PM

## 2021-06-09 ENCOUNTER — Other Ambulatory Visit (HOSPITAL_COMMUNITY): Payer: Self-pay

## 2021-06-09 LAB — CBC
HCT: 25.9 % — ABNORMAL LOW (ref 36.0–46.0)
Hemoglobin: 8.4 g/dL — ABNORMAL LOW (ref 12.0–15.0)
MCH: 24.2 pg — ABNORMAL LOW (ref 26.0–34.0)
MCHC: 32.4 g/dL (ref 30.0–36.0)
MCV: 74.6 fL — ABNORMAL LOW (ref 80.0–100.0)
Platelets: 242 10*3/uL (ref 150–400)
RBC: 3.47 MIL/uL — ABNORMAL LOW (ref 3.87–5.11)
RDW: 16 % — ABNORMAL HIGH (ref 11.5–15.5)
WBC: 14.1 10*3/uL — ABNORMAL HIGH (ref 4.0–10.5)
nRBC: 0 % (ref 0.0–0.2)

## 2021-06-09 LAB — BIRTH TISSUE RECOVERY COLLECTION (PLACENTA DONATION)

## 2021-06-09 MED ORDER — ACETAMINOPHEN 325 MG PO TABS
650.0000 mg | ORAL_TABLET | ORAL | Status: DC | PRN
Start: 2021-06-09 — End: 2023-06-14

## 2021-06-09 MED ORDER — IBUPROFEN 600 MG PO TABS
600.0000 mg | ORAL_TABLET | Freq: Four times a day (QID) | ORAL | 0 refills | Status: DC | PRN
  Filled 2021-06-09: qty 60, 15d supply, fill #0

## 2021-06-09 MED ORDER — FERROUS SULFATE 325 (65 FE) MG PO TABS
325.0000 mg | ORAL_TABLET | ORAL | 1 refills | Status: DC
  Filled 2021-06-09: qty 15, 30d supply, fill #0

## 2021-06-09 NOTE — Lactation Note (Signed)
This note was copied from a baby's chart. Lactation Consultation Note  Patient Name: Boy Maryela Tapper ASTMH'D Date: 06/09/2021 Reason for consult: Follow-up assessment Age:26 hours   P2 mother whose infant is now 61 hours old.  This is a term baby at 41+1 weeks.  Mother's current feeding preference is breast.  In house Spanish interpreter used for interpretation.  Mother had no questions/concerns related to breast feeding.  Baby has been latching/feeding well and mother denies pain with feedings.  Last LATCH score was a 9; voiding/stooling.  Mother has our OP phone number for any concerns after discharge.  No support person present at this time.   Maternal Data    Feeding    LATCH Score                    Lactation Tools Discussed/Used    Interventions    Discharge Discharge Education: Engorgement and breast care  Consult Status Consult Status: Complete Date: 06/09/21 Follow-up type: Call as needed    Sanad Fearnow R Liza Czerwinski 06/09/2021, 8:26 AM

## 2021-06-13 ENCOUNTER — Other Ambulatory Visit (HOSPITAL_COMMUNITY): Payer: Self-pay

## 2021-06-16 ENCOUNTER — Telehealth (HOSPITAL_COMMUNITY): Payer: Self-pay | Admitting: *Deleted

## 2021-06-16 NOTE — Telephone Encounter (Signed)
Left phone voicemail message.  Duffy Rhody, RN 06-16-2021 at 2:33pm

## 2021-07-06 ENCOUNTER — Ambulatory Visit: Payer: Medicaid Other | Admitting: Obstetrics

## 2022-03-25 IMAGING — US US ABDOMEN LIMITED RUQ/ASCITES
1 series · 14 of 25 positions shown · non-contrast
Comparison: None.

CLINICAL DATA: 24-year-old female with abdominal pain.

EXAM:
ULTRASOUND ABDOMEN LIMITED RIGHT UPPER QUADRANT

[Series 1: us abdomen limited ruq (liver/gb) · 14 of 52 slices shown]
[im 1/52]
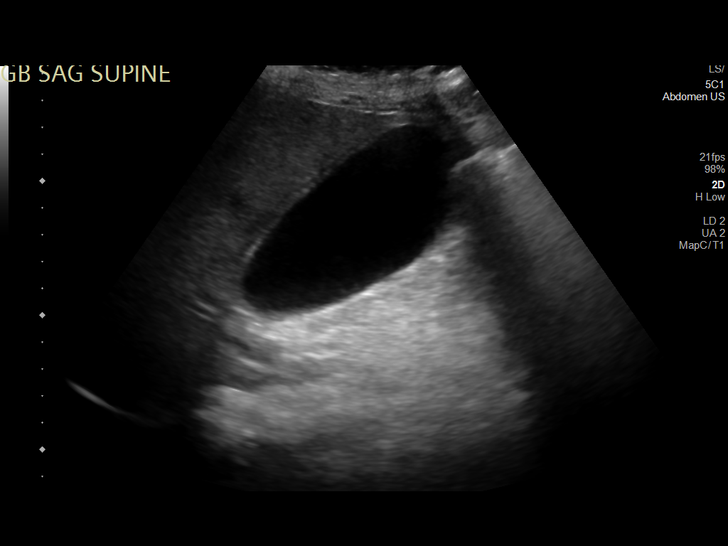
[im 5/52]
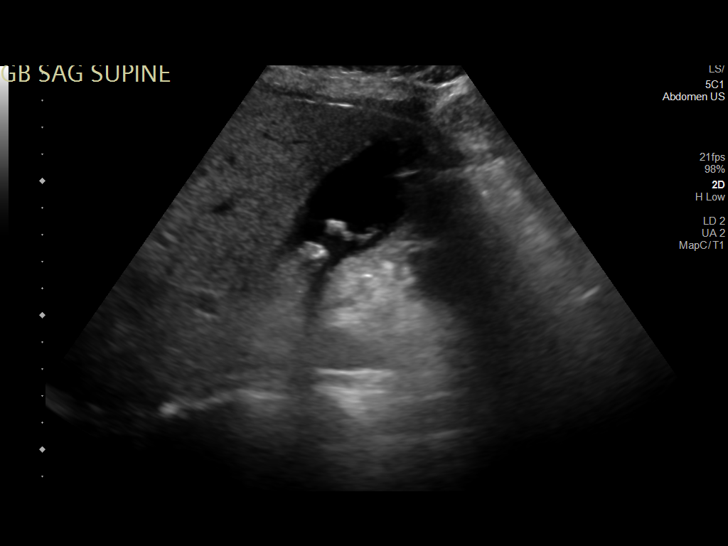
[im 9/52]
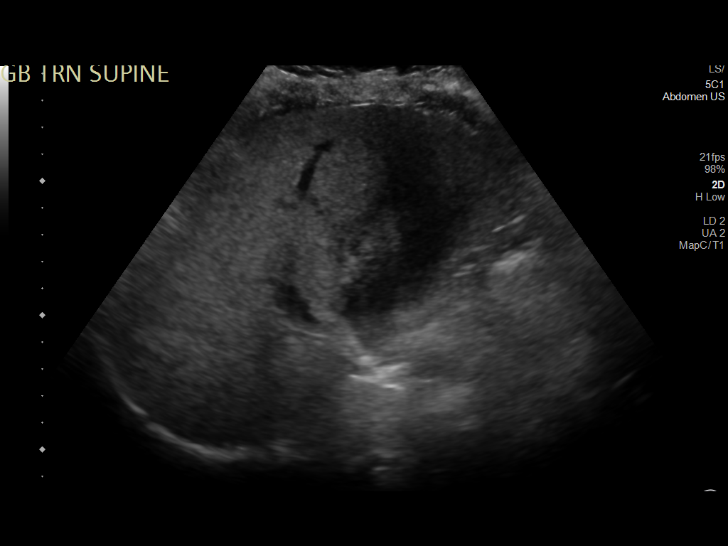
[im 13/52]
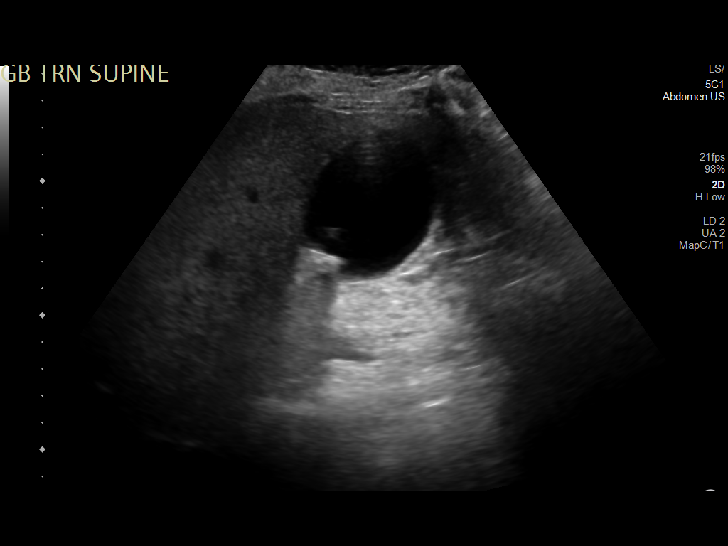
[im 18/52]
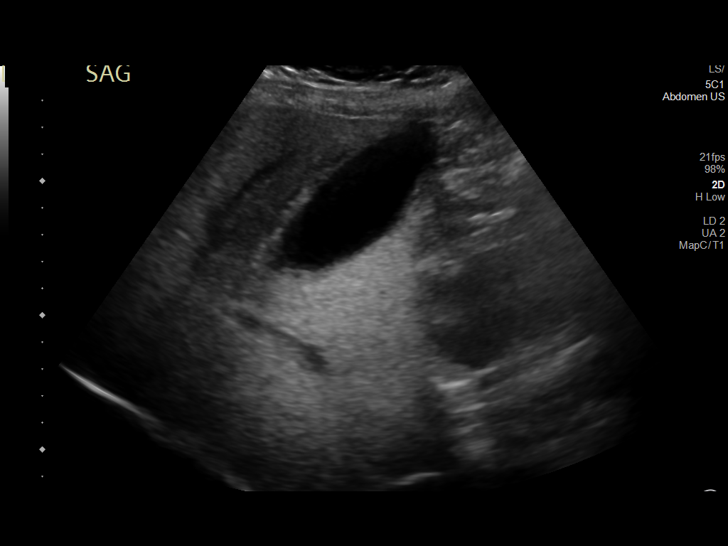
[im 20/52]
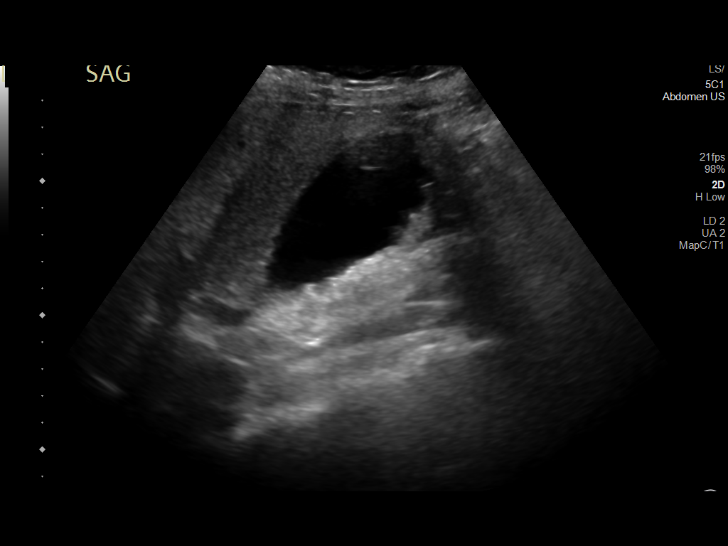
[im 24/52]
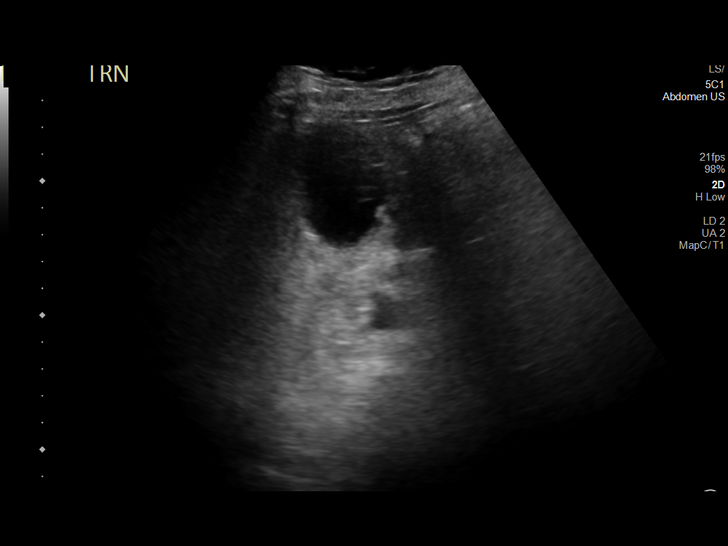
[im 28/52]
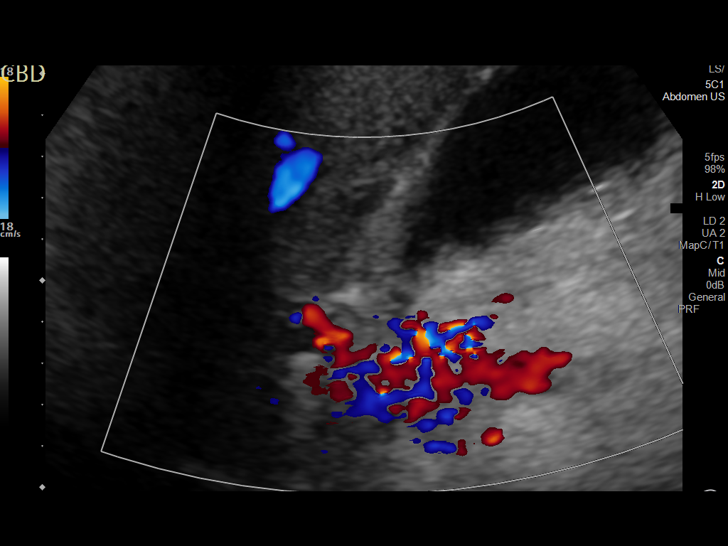
[im 32/52]
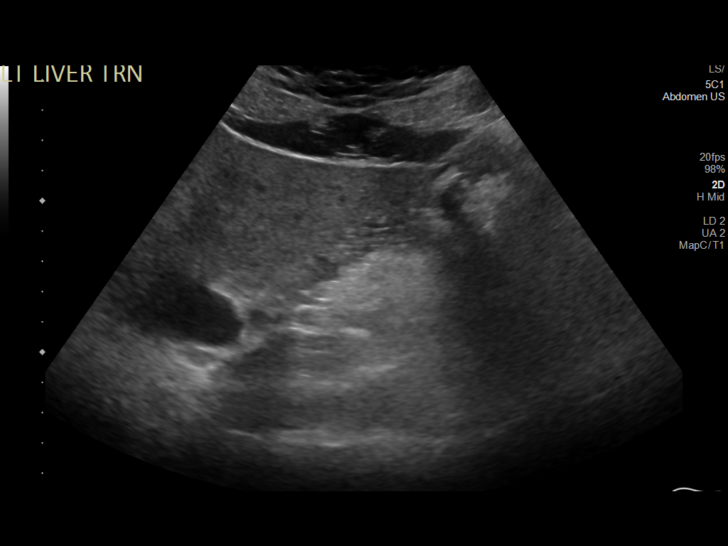
[im 35/52]
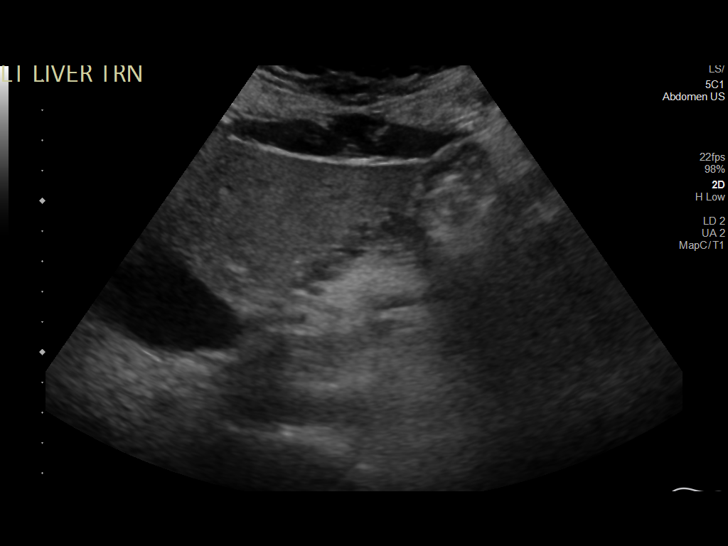
[im 39/52]
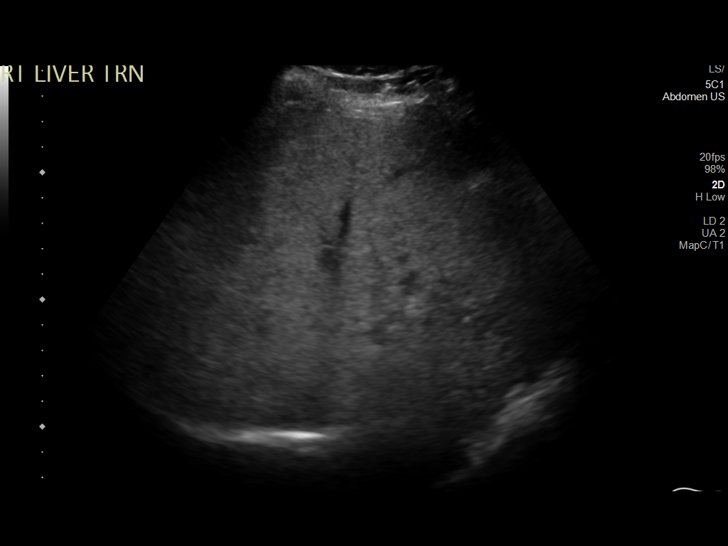
[im 43/52]
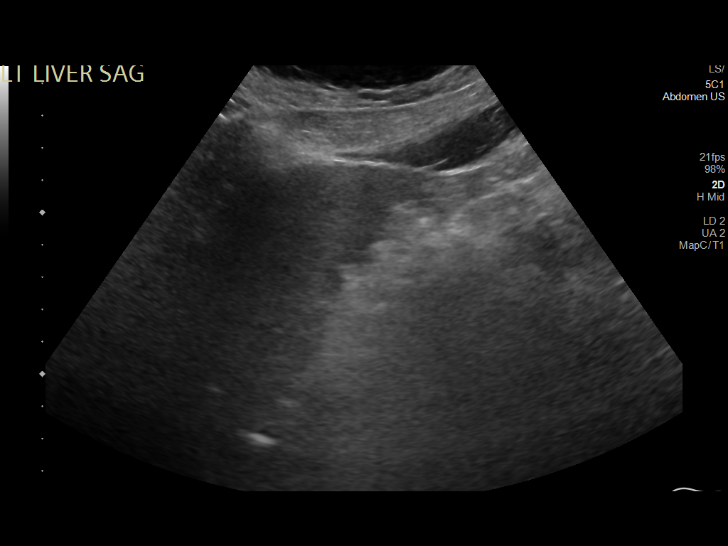
[im 47/52]
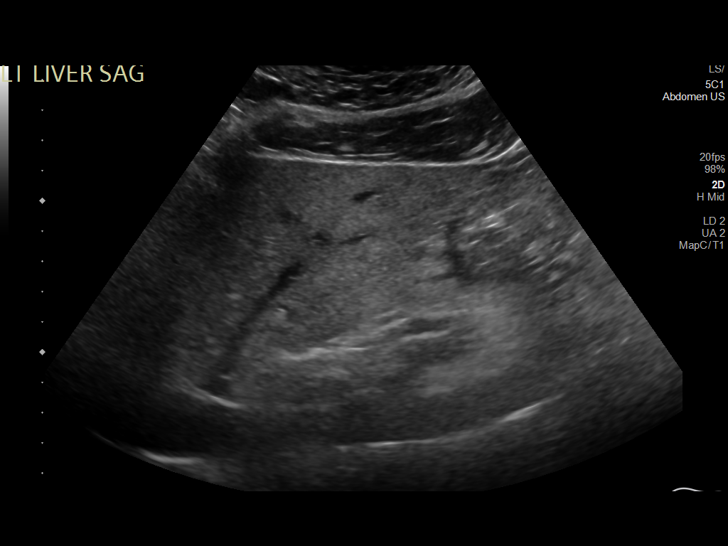
[im 52/52]
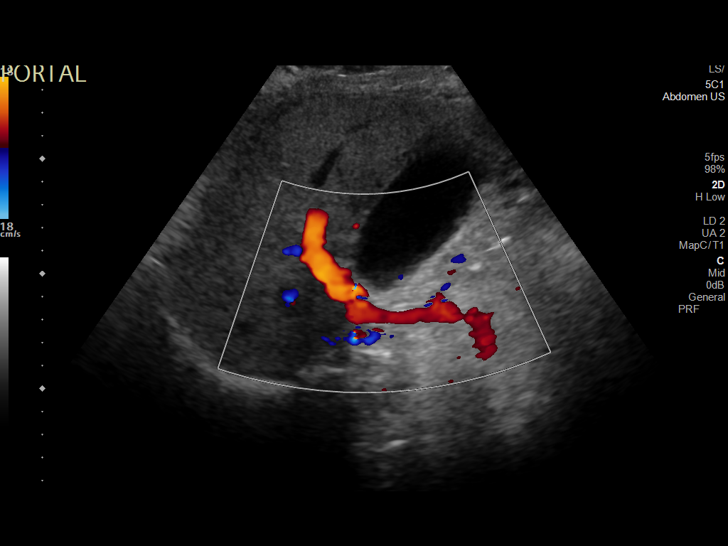

[14 of 25 positions shown; findings below may reference images not displayed]

FINDINGS: Gallbladder:

There multiple stones in the gallbladder. No gallbladder wall
thickening or pericholecystic fluid. Negative sonographic Murphy's
sign.

Common bile duct:

Diameter: 2 mm

Liver:

There is diffuse increased liver echogenicity most commonly seen in
the setting of fatty infiltration. Superimposed inflammation or
fibrosis is not excluded. Clinical correlation is recommended.
Portal vein is patent on color Doppler imaging with normal direction
of blood flow towards the liver.

Other: None.
IMPRESSION: 1. Cholelithiasis without sonographic evidence of acute
cholecystitis.
2. Fatty liver.

## 2023-01-16 NOTE — L&D Delivery Note (Cosign Needed Addendum)
 OB/GYN Faculty Practice Delivery Note  Alison Martin is a 28 y.o. G3P2002 s/p SVD at [redacted]w[redacted]d. She was admitted for SOL.   ROM: 0h 21m with light meconium fluid GBS Status:  Negative/-- (10/10 1115) Maximum Maternal Temperature:  Temp (24hrs), Avg:98.5 F (36.9 C), Min:98.2 F (36.8 C), Max:98.8 F (37.1 C)   Labor Progress: Patient arrived at 8 cm dilation and was augmented with AROM.   Delivery Date/Time: 11/26/2023 at 1524 Delivery: Called to room and patient was complete and pushing. While on the bed, head delivered in OA position. No nuchal cord present. Shoulder and body delivered in usual fashion. Infant with spontaneous cry, placed on mother's abdomen, dried and stimulated. Cord clamped x 2 after 1-minute delay, and cut by FOB. Cord blood drawn. Placenta delivered spontaneously with gentle cord traction and large gush of blood preceding and subsequent to delivery of placenta. Fundus firm with massage and Pitocin , with initial massage an additional single large gush of blood was expressed. Patient given 100 mcg IV fentanyl  for pain control. Labia, perineum, vagina, and cervix inspected with minor 1 cm perineal abrasion. Patient was given 1g TXA due to blood loss.  Placenta:  spontaneous, intact, 3 vessel cord Complications: PPH, f/u AM CBC Lacerations: None EBL: 1230 mL  (likely overestimate including large amount of amniotic fluid on weighed drapes) Analgesia: Fentanyl  100 mcg IV  Newborn Data: Birth date:11/26/2023 Birth time:3:24 PM Gender:Female Living status:Living Apgars:8 ,9  Weight:4050 g         Alison Amy, MD [facility], PGY-1   11/26/2023 3:50 PM  GME ATTESTATION:  Evaluation and management procedures were performed by the [facility] Medicine Resident under my supervision. I was immediately available for direct supervision, assistance and direction throughout this encounter.  I also confirm that I have verified the information  documented in the resident's note, and that I have also personally reperformed the pertinent components of the physical exam and all of the medical decision making activities.  I have also made any necessary editorial changes.  Alison KATHEE Pouch, MD OB Fellow, Faculty Practice [facility], Center for [facility] 11/26/2023 8:24 PM

## 2023-03-31 IMAGING — US US OB LIMITED
1 series · 6 of 6 positions shown · non-contrast
Comparison: none

[Series 1: us ob limited · 6 of 6 slices shown]
[im 1/6]
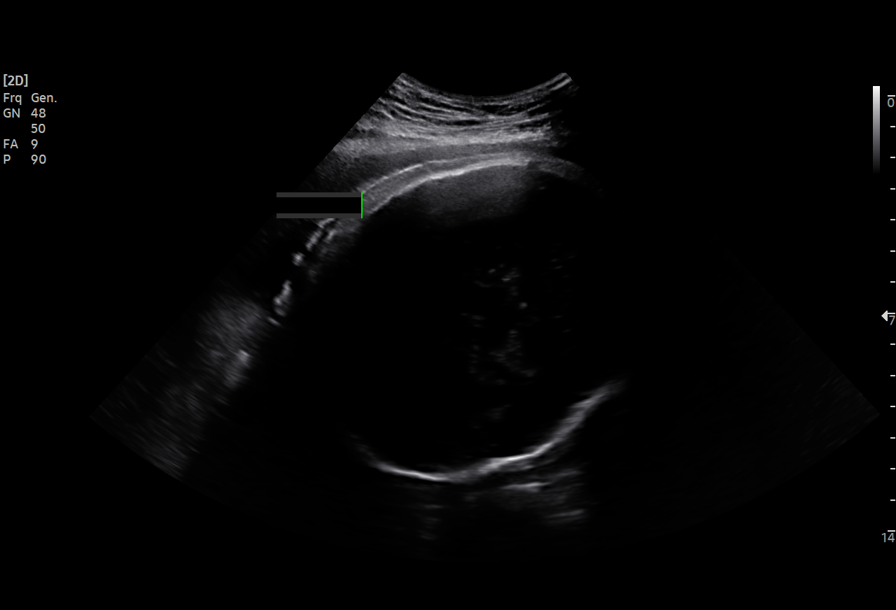
[im 2/6]
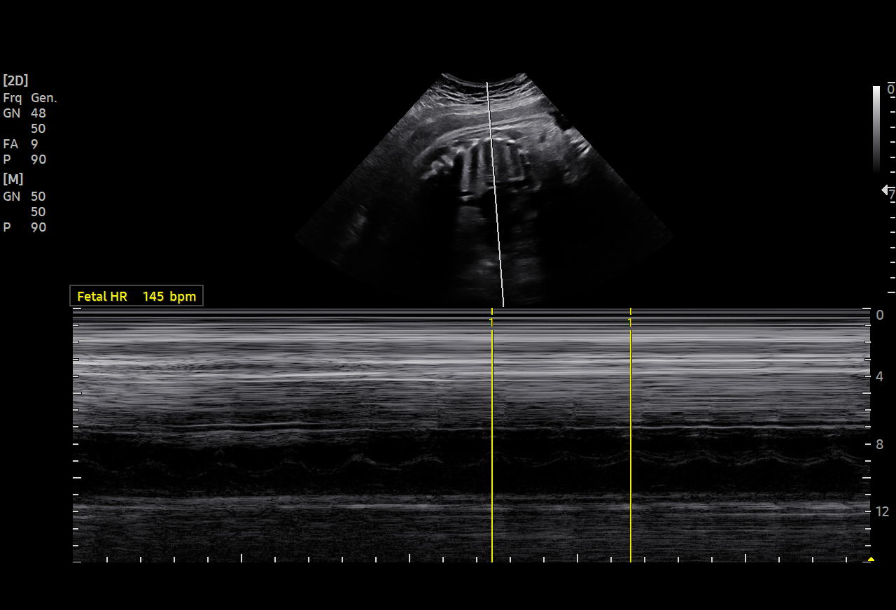
[im 3/6]
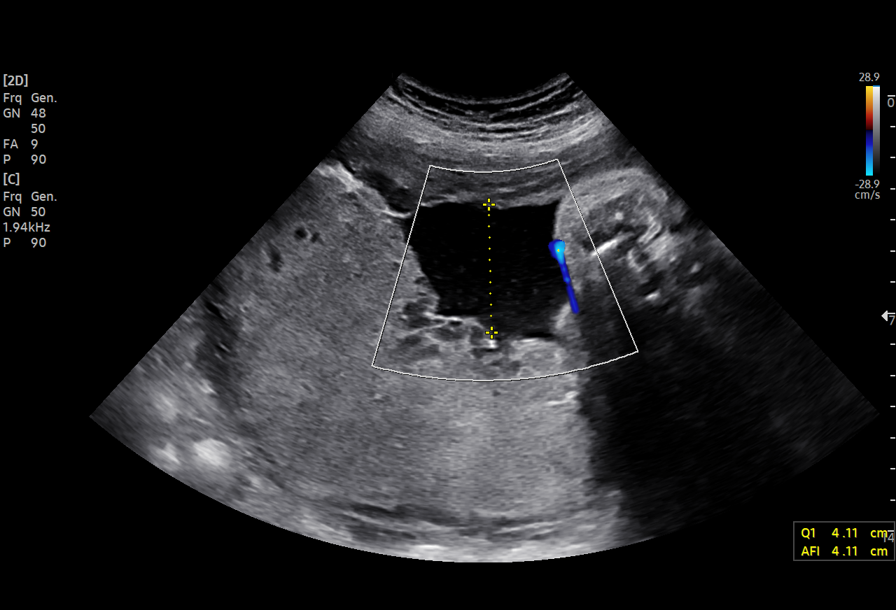
[im 4/6]
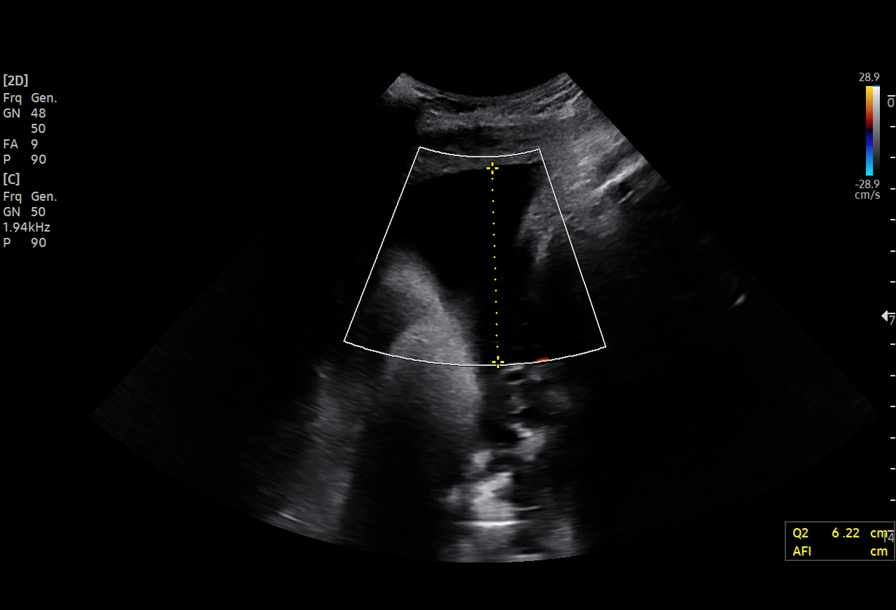
[im 5/6]
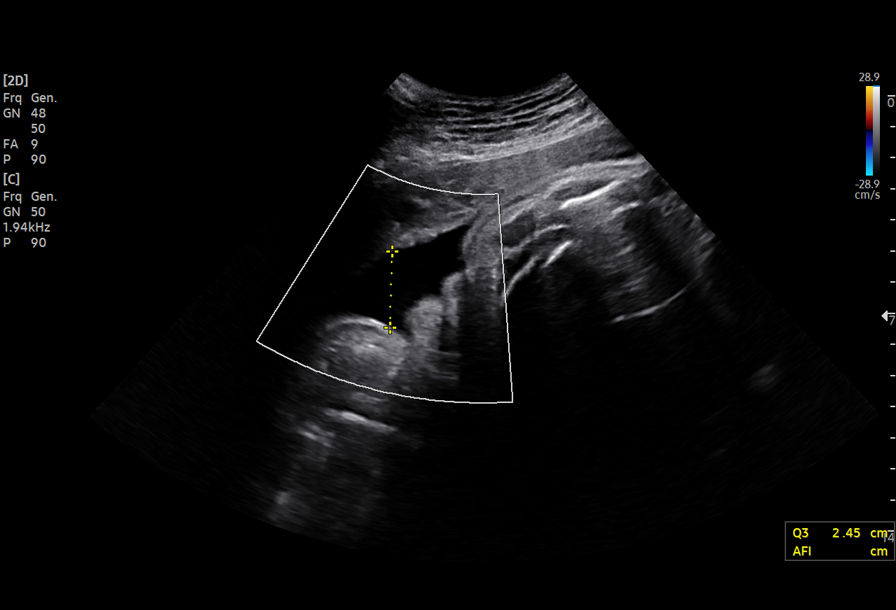
[im 6/6]
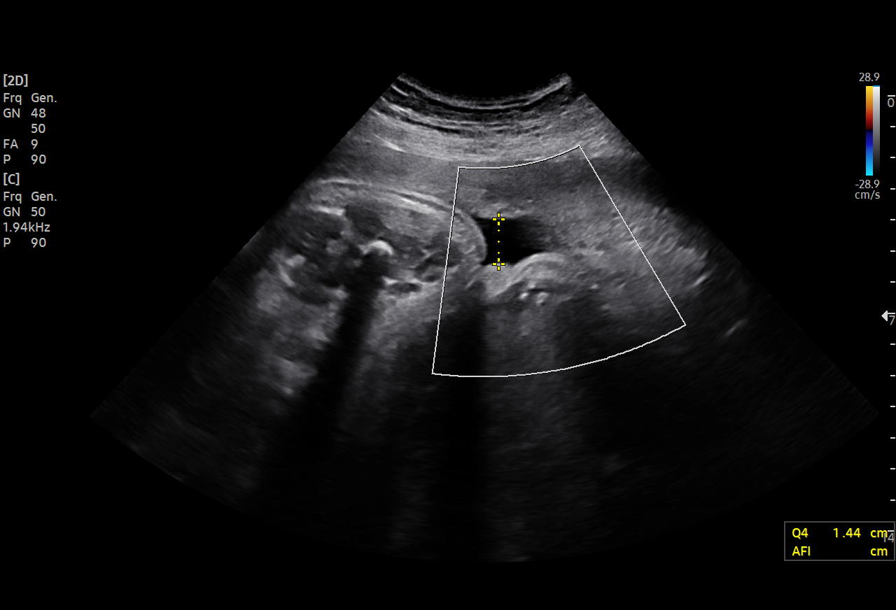

[6 of 6 positions shown; findings below may reference images not displayed]

[REDACTED]care

 1  [HOSPITAL]                         [address].0     KATLAMELO BOLLEN

Indications

 40 weeks gestation of pregnancy
Fetal Evaluation

 Num Of Fetuses:          1
 Fetal Heart Rate(bpm):   145
 Cardiac Activity:        Observed
 Presentation:            Cephalic

 AFI Sum(cm)     %Tile       Largest Pocket(cm)
 14.22           61

 RUQ(cm)       RLQ(cm)       LUQ(cm)        LLQ(cm)

OB History

 Gravidity:    2         Term:   1
 Living:       1
Gestational Age

 LMP:           40w 2d        Date:  08/24/20                  EDD:   05/31/21
 Best:          40w 2d     Det. By:  LMP  (08/24/20)          EDD:   05/31/21
Comments
 A limited ultrasound performed today shows that the fetus is
 in the vertex presentation.

 There was normal amniotic fluid noted.
Impression

 Fetus in vertex position
Recommendations

 F/U OB U/S as clinically indicated

## 2023-06-06 ENCOUNTER — Ambulatory Visit

## 2023-06-06 VITALS — BP 118/72 | HR 78 | Wt 203.0 lb

## 2023-06-06 DIAGNOSIS — Z3200 Encounter for pregnancy test, result unknown: Secondary | ICD-10-CM

## 2023-06-06 DIAGNOSIS — Z3201 Encounter for pregnancy test, result positive: Secondary | ICD-10-CM

## 2023-06-06 LAB — POCT URINE PREGNANCY: Preg Test, Ur: POSITIVE — AB

## 2023-06-06 NOTE — Progress Notes (Signed)
..  Alison Martin presents today for UPT. She has no unusual complaints. Pt states that she is already taking PNVs. LMP: 02/15/23    OBJECTIVE: Appears well, in no apparent distress.  OB History     Gravida  3   Para  2   Term  2   Preterm      AB      Living  2      SAB      IAB      Ectopic      Multiple  0   Live Births  2          Home UPT Result: Positive In-Office UPT result: Positive I have reviewed the patient's medical, obstetrical, social, and family histories, and medications.   ASSESSMENT: Positive pregnancy test  PLAN Prenatal care to be completed at: [facility] Provided safe med list Provided map of hospital

## 2023-06-12 ENCOUNTER — Other Ambulatory Visit: Payer: Self-pay | Admitting: *Deleted

## 2023-06-12 ENCOUNTER — Other Ambulatory Visit (INDEPENDENT_AMBULATORY_CARE_PROVIDER_SITE_OTHER): Payer: Self-pay

## 2023-06-12 ENCOUNTER — Ambulatory Visit: Admitting: *Deleted

## 2023-06-12 VITALS — BP 118/77 | HR 77 | Wt 203.1 lb

## 2023-06-12 DIAGNOSIS — Z3A16 16 weeks gestation of pregnancy: Secondary | ICD-10-CM | POA: Diagnosis not present

## 2023-06-12 DIAGNOSIS — Z362 Encounter for other antenatal screening follow-up: Secondary | ICD-10-CM

## 2023-06-12 DIAGNOSIS — Z3482 Encounter for supervision of other normal pregnancy, second trimester: Secondary | ICD-10-CM | POA: Diagnosis not present

## 2023-06-12 DIAGNOSIS — Z1331 Encounter for screening for depression: Secondary | ICD-10-CM

## 2023-06-12 DIAGNOSIS — Z3480 Encounter for supervision of other normal pregnancy, unspecified trimester: Secondary | ICD-10-CM | POA: Insufficient documentation

## 2023-06-12 NOTE — Patient Instructions (Signed)

## 2023-06-12 NOTE — Progress Notes (Signed)
 New OB Intake  Spanish Interpreter used for entire Intake visit.  I connected with Alison Martin  on 06/12/23 at 10:15 AM EDT by In Person Visit and verified that I am speaking with the correct person using two identifiers. Nurse is located at [facility] and pt is located at [place].  I discussed the limitations, risks, security and privacy concerns of performing an evaluation and management service by telephone and the availability of in person appointments. I also discussed with the patient that there may be a patient responsible charge related to this service. The patient expressed understanding and agreed to proceed.  I explained I am completing New OB Intake today. We discussed EDD of 11/22/2023, by Last Menstrual Period. Pt is G3P2002. I reviewed her allergies, medications and Medical/Surgical/OB history.    Patient Active Problem List   Diagnosis Date Noted   Indication for care in labor and delivery, antepartum 06/07/2021   Limited prenatal care 06/07/2021   Chlamydia infection affecting pregnancy in third trimester 03/22/2021   Supervision of other normal pregnancy, antepartum 03/15/2021   History of cholecystitis 03/15/2021     Concerns addressed today  Delivery Plans Plans to deliver at [facility]. Discussed the nature of our practice with multiple providers including residents and students. Due to the size of the practice, the delivering provider may not be the same as those providing prenatal care.   Patient is not interested in water birth.  MyChart/Babyscripts MyChart access verified. I explained pt will have some visits in office and some virtually. Babyscripts instructions given and order placed. Patient verifies receipt of registration text/e-mail. Account successfully created and app downloaded. If patient is a candidate for Optimized scheduling, add to sticky note.   Blood Pressure Cuff/Weight Scale Blood pressure cuff ordered for patient to pick-up from [employer]. Explained after first prenatal appt pt will check weekly and document in Babyscripts. Patient does not have weight scale; patient may purchase if they desire to track weight weekly in Babyscripts.  Anatomy US  Explained first scheduled US  will be around 19 weeks. Anatomy US  scheduled for TBD at TBD.  Is patient a candidate for Babyscripts Optimization? No, due to Risk Factors and Language Barrier.   First visit review I reviewed new OB appt with patient. Explained pt will be seen by Dr. Racheal Buddle at first visit. Discussed [facility] [place] genetic screening with patient. Dr. Regino Caprio and [place].. Routine prenatal labs OB Urine only collected at today's visit. Initial OB labs deferred to New OB due to laboratory schedule.   Last Pap No results found for: "DIAGPAP"  Donette Furlong, RN 06/12/2023  10:20 AM

## 2023-06-14 ENCOUNTER — Ambulatory Visit: Admitting: Obstetrics and Gynecology

## 2023-06-14 ENCOUNTER — Other Ambulatory Visit (HOSPITAL_COMMUNITY)
Admission: RE | Admit: 2023-06-14 | Discharge: 2023-06-14 | Disposition: A | Discharge status: Home / Self Care | Source: Ambulatory Visit | Attending: Obstetrics and Gynecology | Admitting: Obstetrics and Gynecology

## 2023-06-14 VITALS — BP 101/67 | HR 79 | Wt 203.0 lb

## 2023-06-14 DIAGNOSIS — O0932 Supervision of pregnancy with insufficient antenatal care, second trimester: Secondary | ICD-10-CM

## 2023-06-14 DIAGNOSIS — Z3A17 17 weeks gestation of pregnancy: Secondary | ICD-10-CM

## 2023-06-14 DIAGNOSIS — Z3400 Encounter for supervision of normal first pregnancy, unspecified trimester: Secondary | ICD-10-CM | POA: Diagnosis present

## 2023-06-14 DIAGNOSIS — O0930 Supervision of pregnancy with insufficient antenatal care, unspecified trimester: Secondary | ICD-10-CM | POA: Insufficient documentation

## 2023-06-14 DIAGNOSIS — Z3480 Encounter for supervision of other normal pregnancy, unspecified trimester: Secondary | ICD-10-CM

## 2023-06-14 NOTE — Addendum Note (Signed)
 Addended by: Coralyn Derry D on: 06/14/2023 10:06 AM   Modules accepted: Orders

## 2023-06-14 NOTE — Progress Notes (Signed)
Pt complains of lower back pain

## 2023-06-14 NOTE — Progress Notes (Signed)
 INITIAL PRENATAL VISIT NOTE  Subjective:  Alison Martin is a 28 y.o. G3P2002 at [redacted]w[redacted]d by exact LMP being seen today for her initial prenatal visit. She has an obstetric history significant for SVD x 2.  She denies any history of gestational diabetes or hypertensive issues. She has a medical history significant for cholecystitis.  Patient reports no complaints.  Contractions: Not present. Vag. Bleeding: None.  Movement: Present. Denies leaking of fluid.    Past Medical History:  Diagnosis Date   Chlamydia trachomatis infection in pregnancy in third trimester 10/15/2019   + 05/2019 + 07/08/19 (had not picked up medication)  Confirmed treatment in early August TOC 10/15/19 + Retreated, negative TOC on 10/27   Medical history non-contributory     Past Surgical History:  Procedure Laterality Date   NO PAST SURGERIES      OB History  Gravida Para Term Preterm AB Living  3 2 2   2   SAB IAB Ectopic Multiple Live Births     0 2    # Outcome Date GA Lbr Len/2nd Weight Sex Type Anes PTL Lv  3 Current           2 Term 06/08/21 [redacted]w[redacted]d / 00:06 9 lb 1 oz (4.11 kg) M Vag-Spont None  LIV     Birth Comments: WNL  1 Term 11/12/19 [redacted]w[redacted]d 09:33 / 01:11 9 lb 12.1 oz (4.425 kg) F Vag-Spont EPI  LIV     Birth Comments: WNL    Social History   Socioeconomic History   Marital status: Significant Other    Spouse name: Not on file   Number of children: Not on file   Years of education: Not on file   Highest education level: 9th grade  Occupational History   Not on file  Tobacco Use   Smoking status: Former    Current packs/day: 0.00    Types: Cigarettes    Quit date: 2018    Years since quitting: 7.4   Smokeless tobacco: Never   Tobacco comments:    stopped about year ago  Vaping Use   Vaping status: Never Used  Substance and Sexual Activity   Alcohol use: Not Currently    Comment: socially, none in the last year   Drug use: Never   Sexual activity: Yes    Birth control/protection:  None  Other Topics Concern   Not on file  Social History Narrative   Not on file   Social Drivers of Health   Financial Resource Strain: Not on file  Food Insecurity: Not on file  Transportation Needs: Not on file  Physical Activity: Not on file  Stress: Not on file  Social Connections: Not on file    Family History  Problem Relation Age of Onset   Hypertension Mother      Current Outpatient Medications:    Prenatal Vit-Fe Fumarate-FA (MULTIVITAMIN-PRENATAL) 27-0.8 MG TABS tablet, Take 1 tablet by mouth daily at 12 noon., Disp: , Rfl:    ferrous sulfate  325 (65 FE) MG tablet, Take 1 tablet (325 mg total) by mouth every other day., Disp: 30 tablet, Rfl: 1  No Known Allergies  Review of Systems: Negative except for what is mentioned in HPI.  Objective:   Vitals:   06/14/23 0841  BP: 101/67  Pulse: 79  Weight: 203 lb (92.1 kg)    Fetal Status: Fetal Heart Rate (bpm): 140 Fundal Height: 17 cm Movement: Present     Physical Exam: BP 101/67   Pulse  79   Wt 203 lb (92.1 kg)   LMP 02/15/2023 (Exact Date)   BMI 30.87 kg/m  CONSTITUTIONAL: Well-developed, well-nourished female in no acute distress.  NEUROLOGIC: Alert and oriented to person, place, and time. Normal reflexes, muscle tone coordination. No cranial nerve deficit noted. PSYCHIATRIC: Normal mood and affect. Normal behavior. Normal judgment and thought content. SKIN: Skin is warm and dry. No rash noted. Not diaphoretic. No erythema. No pallor. HENT:  Normocephalic, atraumatic, External right and left ear normal. Oropharynx is clear and moist EYES: Conjunctivae and EOM are normal.  NECK: Normal range of motion, supple, no masses CARDIOVASCULAR: Normal heart rate noted, regular rhythm RESPIRATORY: Effort and breath sounds normal, no problems with respiration noted BREASTS: deferred ABDOMEN: Soft, nontender, nondistended, gravid. GU: normal appearing external female genitalia, multiparous, normal appearing  cervix, moderate white discharge in vagina, no lesions noted, pap and swab taken without incident Bimanual: 17 weeks sized uterus, no adnexal tenderness or palpable lesions noted MUSCULOSKELETAL: Normal range of motion. EXT:  No edema and no tenderness. 2+ distal pulses.   Assessment and Plan:  Pregnancy: G3P2002 at [redacted]w[redacted]d by LMP  1. Supervision of other normal pregnancy, antepartum (Primary) Continue routine prenatal care Initial labwork today Schedule anatomy scan - Cytology - PAP( [place]) - Cervicovaginal ancillary only( [place])  2. [redacted] weeks gestation of pregnancy   3. Late prenatal care    Preterm labor symptoms and general obstetric precautions including but not limited to vaginal bleeding, contractions, leaking of fluid and fetal movement were reviewed in detail with the patient.  Please refer to After Visit Summary for other counseling recommendations.   Return in about 4 weeks (around 07/12/2023) for ROB, in person.  [name] 06/14/2023 9:29 AM

## 2023-06-14 NOTE — Addendum Note (Signed)
 Addended by: Coralyn Derry D on: 06/14/2023 09:53 AM   Modules accepted: Orders

## 2023-06-15 LAB — CBC/D/PLT+RPR+RH+ABO+RUBIGG...
Antibody Screen: NEGATIVE
Basophils Absolute: 0 10*3/uL (ref 0.0–0.2)
Basos: 0 %
EOS (ABSOLUTE): 0 10*3/uL (ref 0.0–0.4)
Eos: 0 %
HCV Ab: NONREACTIVE
HIV Screen 4th Generation wRfx: NONREACTIVE
Hematocrit: 39.1 % (ref 34.0–46.6)
Hemoglobin: 12.1 g/dL (ref 11.1–15.9)
Hepatitis B Surface Ag: NEGATIVE
Immature Grans (Abs): 0 10*3/uL (ref 0.0–0.1)
Immature Granulocytes: 0 %
Lymphocytes Absolute: 2.2 10*3/uL (ref 0.7–3.1)
Lymphs: 20 %
MCH: 25.3 pg — ABNORMAL LOW (ref 26.6–33.0)
MCHC: 30.9 g/dL — ABNORMAL LOW (ref 31.5–35.7)
MCV: 82 fL (ref 79–97)
Monocytes Absolute: 0.4 10*3/uL (ref 0.1–0.9)
Monocytes: 4 %
Neutrophils Absolute: 8.6 10*3/uL — ABNORMAL HIGH (ref 1.4–7.0)
Neutrophils: 76 %
Platelets: 298 10*3/uL (ref 150–450)
RBC: 4.79 x10E6/uL (ref 3.77–5.28)
RDW: 15.4 % (ref 11.7–15.4)
RPR Ser Ql: NONREACTIVE
Rh Factor: POSITIVE
Rubella Antibodies, IGG: 1.49 {index} (ref >0.99)
WBC: 11.3 10*3/uL — ABNORMAL HIGH (ref 3.4–10.8)

## 2023-06-15 LAB — HEMOGLOBIN A1C
Est. average glucose Bld gHb Est-mCnc: 114 mg/dL
Hgb A1c MFr Bld: 5.6 % (ref 4.8–5.6)

## 2023-06-15 LAB — HCV INTERPRETATION

## 2023-06-17 ENCOUNTER — Ambulatory Visit: Payer: Self-pay | Admitting: Obstetrics and Gynecology

## 2023-06-17 DIAGNOSIS — Z3480 Encounter for supervision of other normal pregnancy, unspecified trimester: Secondary | ICD-10-CM

## 2023-06-17 LAB — CERVICOVAGINAL ANCILLARY ONLY
Chlamydia: NEGATIVE
Comment: NEGATIVE
Comment: NEGATIVE
Comment: NORMAL
Neisseria Gonorrhea: NEGATIVE
Trichomonas: NEGATIVE

## 2023-06-19 LAB — AFP, SERUM, OPEN SPINA BIFIDA
AFP MoM: 0.58
AFP Value: 18.1 ng/mL
Gest. Age on Collection Date: 17 wk
Maternal Age At EDD: 28.3 a
OSBR Risk 1 IN: 10000
Test Results:: NEGATIVE
Weight: 203 [lb_av]

## 2023-06-19 LAB — CYTOLOGY - PAP
Adequacy: ABSENT
Comment: NEGATIVE
Diagnosis: NEGATIVE
Diagnosis: REACTIVE
High risk HPV: NEGATIVE

## 2023-06-20 LAB — PANORAMA PRENATAL TEST FULL PANEL:PANORAMA TEST PLUS 5 ADDITIONAL MICRODELETIONS: FETAL FRACTION: 11.1

## 2023-06-24 ENCOUNTER — Ambulatory Visit: Payer: Self-pay | Admitting: Obstetrics and Gynecology

## 2023-06-24 DIAGNOSIS — Z3480 Encounter for supervision of other normal pregnancy, unspecified trimester: Secondary | ICD-10-CM

## 2023-07-12 ENCOUNTER — Encounter: Admitting: Physician Assistant

## 2023-07-12 NOTE — Progress Notes (Deleted)
   PRENATAL VISIT NOTE  Subjective:  Alison Martin is a 28 y.o. G3P2002 at [redacted]w[redacted]d being seen today for ongoing prenatal care.  She is currently monitored for the following issues for this {Blank single:19197::high-risk,low-risk} pregnancy and has History of cholecystitis; Chlamydia infection affecting pregnancy in third trimester; Indication for care in labor and delivery, antepartum; Limited prenatal care; Supervision of other normal pregnancy, antepartum; and Late prenatal care on their problem list.  Patient reports {sx:14538}.   .  .   . Denies leaking of fluid.   The following portions of the patient's history were reviewed and updated as appropriate: allergies, current medications, past family history, past medical history, past social history, past surgical history and problem list.   Objective:    There were no vitals filed for this visit.  Fetal Status:           General: Alert, oriented and cooperative. Patient is in no acute distress.  Skin: Skin is warm and dry. No rash noted.   Cardiovascular: Normal heart rate noted  Respiratory: Normal respiratory effort, no problems with respiration noted  Abdomen: Soft, gravid, appropriate for gestational age.        Pelvic: {Blank single:19197::Cervical exam performed in the presence of a chaperone,Cervical exam deferred}        Extremities: Normal range of motion.     Mental Status: Normal mood and affect. Normal behavior. Normal judgment and thought content.   Assessment and Plan:  Pregnancy: G3P2002 at [redacted]w[redacted]d  1. Supervision of other normal pregnancy, antepartum (Primary) ***  2. [redacted] weeks gestation of pregnancy ***  3. Late prenatal care ***  {Blank single:19197::Term,Preterm} labor symptoms and general obstetric precautions including but not limited to vaginal bleeding, contractions, leaking of fluid and fetal movement were reviewed in detail with the patient. Please refer to After Visit Summary for other  counseling recommendations.   No follow-ups on file.  Future Appointments  Date Time Provider Department Center  07/12/2023  8:15 AM Dawsen Krieger E, PA-C [facility] None  07/26/2023  7:00 AM [facility] PROVIDER 1 [facility]  07/26/2023  7:30 AM [facility] US4 [facility]    Jorene FORBES Moats, PA-C

## 2023-07-17 ENCOUNTER — Ambulatory Visit (INDEPENDENT_AMBULATORY_CARE_PROVIDER_SITE_OTHER): Admitting: Family Medicine

## 2023-07-17 VITALS — BP 114/67 | HR 86 | Wt 205.5 lb

## 2023-07-17 DIAGNOSIS — Z3480 Encounter for supervision of other normal pregnancy, unspecified trimester: Secondary | ICD-10-CM

## 2023-07-17 DIAGNOSIS — Z3A21 21 weeks gestation of pregnancy: Secondary | ICD-10-CM

## 2023-07-17 NOTE — Progress Notes (Signed)
 Pt presents for rob. Pt has no questions or concerns at this time.

## 2023-07-17 NOTE — Progress Notes (Signed)
   PRENATAL VISIT NOTE  Subjective:  Alison Martin is a 28 y.o. G3P2002 at [redacted]w[redacted]d being seen today for ongoing prenatal care.  She is currently monitored for the following issues for this low-risk pregnancy and has Supervision of other normal pregnancy, antepartum and Late prenatal care on their problem list.  Patient reports no complaints.  Contractions: Not present. Vag. Bleeding: None.  Movement: Present. Denies leaking of fluid.   The following portions of the patient's history were reviewed and updated as appropriate: allergies, current medications, past family history, past medical history, past social history, past surgical history and problem list.   Objective:   Vitals:   07/17/23 0933  BP: 114/67  Pulse: 86  Weight: 205 lb 8 oz (93.2 kg)    Fetal Status: Fetal Heart Rate (bpm): 144 Fundal Height: 21 cm Movement: Present     General:  Alert, oriented and cooperative. Patient is in no acute distress.  Skin: Skin is warm and dry. No rash noted.   Cardiovascular: Normal heart rate noted  Respiratory: Normal respiratory effort, no problems with respiration noted  Abdomen: Soft, gravid, appropriate for gestational age.  Pain/Pressure: Present     Pelvic: Cervical exam deferred        Extremities: Normal range of motion.  Edema: Trace  Mental Status: Normal mood and affect. Normal behavior. Normal judgment and thought content.   Assessment and Plan:  Pregnancy: G3P2002 at [redacted]w[redacted]d  Supervision of other normal pregnancy, antepartum  [redacted] weeks gestation of pregnancy Prenatal course reviewed BP, HR, FHR normal Feeling regular FM  Pt reports cramping occasionally, reviewed hydration and importance of rest Anatomy U/S scheduled 7/11   Preterm labor symptoms and general obstetric precautions including but not limited to vaginal bleeding, contractions, leaking of fluid and fetal movement were reviewed in detail with the patient. Please refer to After Visit Summary for other  counseling recommendations.   Return in about 4 weeks (around 08/14/2023) for Routine prenatal care.  Future Appointments  Date Time Provider Department Center  07/26/2023  7:00 AM [facility] PROVIDER 1 [facility]  07/26/2023  7:30 AM [facility] US4 [facility]  08/16/2023  9:35 AM Constant, Winton, MD [facility] None    Alain Sor, MD

## 2023-07-26 ENCOUNTER — Other Ambulatory Visit: Payer: Self-pay | Admitting: *Deleted

## 2023-07-26 ENCOUNTER — Ambulatory Visit: Payer: MEDICAID | Attending: Obstetrics and Gynecology

## 2023-07-26 ENCOUNTER — Ambulatory Visit (HOSPITAL_BASED_OUTPATIENT_CLINIC_OR_DEPARTMENT_OTHER): Payer: MEDICAID | Admitting: Obstetrics and Gynecology

## 2023-07-26 VITALS — BP 108/58 | HR 69

## 2023-07-26 DIAGNOSIS — O0930 Supervision of pregnancy with insufficient antenatal care, unspecified trimester: Secondary | ICD-10-CM | POA: Insufficient documentation

## 2023-07-26 DIAGNOSIS — O0932 Supervision of pregnancy with insufficient antenatal care, second trimester: Secondary | ICD-10-CM

## 2023-07-26 DIAGNOSIS — O99212 Obesity complicating pregnancy, second trimester: Secondary | ICD-10-CM

## 2023-07-26 DIAGNOSIS — Z3A23 23 weeks gestation of pregnancy: Secondary | ICD-10-CM | POA: Insufficient documentation

## 2023-07-26 DIAGNOSIS — Z3A32 32 weeks gestation of pregnancy: Secondary | ICD-10-CM

## 2023-07-26 DIAGNOSIS — Z3200 Encounter for pregnancy test, result unknown: Secondary | ICD-10-CM | POA: Insufficient documentation

## 2023-07-26 DIAGNOSIS — Z362 Encounter for other antenatal screening follow-up: Secondary | ICD-10-CM

## 2023-07-26 DIAGNOSIS — E669 Obesity, unspecified: Secondary | ICD-10-CM

## 2023-07-26 DIAGNOSIS — O99210 Obesity complicating pregnancy, unspecified trimester: Secondary | ICD-10-CM

## 2023-07-26 NOTE — Progress Notes (Signed)
 After review, MFM consult with provider is not indicated for today  Arna Ranks, MD 07/26/2023 10:09 AM  [facility]

## 2023-08-16 ENCOUNTER — Encounter: Payer: Self-pay | Admitting: Obstetrics and Gynecology

## 2023-08-16 ENCOUNTER — Ambulatory Visit: Admitting: Obstetrics and Gynecology

## 2023-08-16 VITALS — BP 115/74 | HR 109 | Wt 206.0 lb

## 2023-08-16 DIAGNOSIS — Z3480 Encounter for supervision of other normal pregnancy, unspecified trimester: Secondary | ICD-10-CM

## 2023-08-16 DIAGNOSIS — Z3A26 26 weeks gestation of pregnancy: Secondary | ICD-10-CM | POA: Diagnosis not present

## 2023-08-16 DIAGNOSIS — O0932 Supervision of pregnancy with insufficient antenatal care, second trimester: Secondary | ICD-10-CM | POA: Diagnosis not present

## 2023-08-16 DIAGNOSIS — O0930 Supervision of pregnancy with insufficient antenatal care, unspecified trimester: Secondary | ICD-10-CM

## 2023-08-16 MED ORDER — FERROUS SULFATE 325 (65 FE) MG PO TABS: 325.0000 mg | ORAL_TABLET | ORAL | 2 refills | Status: DC

## 2023-08-16 NOTE — Progress Notes (Signed)
 ROB, c/o fatigue.

## 2023-08-16 NOTE — Progress Notes (Signed)
   PRENATAL VISIT NOTE  Subjective:  Alison Martin is a 28 y.o. G3P2002 at [redacted]w[redacted]d being seen today for ongoing prenatal care.  She is currently monitored for the following issues for this low-risk pregnancy and has Supervision of other normal pregnancy, antepartum and Late prenatal care on their problem list.  Patient reports fatigue.  Contractions: Not present. Vag. Bleeding: None.  Movement: Present. Denies leaking of fluid.   The following portions of the patient's history were reviewed and updated as appropriate: allergies, current medications, past family history, past medical history, past social history, past surgical history and problem list.   Objective:    Vitals:   08/16/23 0929  BP: 115/74  Pulse: (!) 109  Weight: 206 lb (93.4 kg)    Fetal Status:  Fetal Heart Rate (bpm): 155   Movement: Present    General: Alert, oriented and cooperative. Patient is in no acute distress.  Skin: Skin is warm and dry. No rash noted.   Cardiovascular: Normal heart rate noted  Respiratory: Normal respiratory effort, no problems with respiration noted  Abdomen: Soft, gravid, appropriate for gestational age.  Pain/Pressure: Present     Pelvic: Cervical exam deferred        Extremities: Normal range of motion.  Edema: Trace  Mental Status: Normal mood and affect. Normal behavior. Normal judgment and thought content.   Assessment and Plan:  Pregnancy: G3P2002 at [redacted]w[redacted]d 1. Supervision of other normal pregnancy, antepartum (Primary) Patient is doing well  Third trimester labs and glucola next visit Follow up growth ultrasound 9/12  2. Late prenatal care   Preterm labor symptoms and general obstetric precautions including but not limited to vaginal bleeding, contractions, leaking of fluid and fetal movement were reviewed in detail with the patient. Please refer to After Visit Summary for other counseling recommendations.   Return in about 2 weeks (around 08/30/2023) for in person, ROB, Low  risk.  Future Appointments  Date Time Provider Department Center  09/27/2023  8:00 AM [facility] PROVIDER 1 [facility]  09/27/2023  8:30 AM [facility] US2 [facility]    Winton Felt, MD

## 2023-08-29 ENCOUNTER — Encounter: Payer: Self-pay | Admitting: Obstetrics and Gynecology

## 2023-08-29 ENCOUNTER — Other Ambulatory Visit

## 2023-09-10 ENCOUNTER — Encounter: Payer: Self-pay | Admitting: Obstetrics and Gynecology

## 2023-09-10 ENCOUNTER — Other Ambulatory Visit

## 2023-09-10 ENCOUNTER — Ambulatory Visit: Payer: Self-pay | Admitting: Obstetrics and Gynecology

## 2023-09-10 VITALS — BP 103/65 | HR 76 | Wt 210.0 lb

## 2023-09-10 DIAGNOSIS — O0930 Supervision of pregnancy with insufficient antenatal care, unspecified trimester: Secondary | ICD-10-CM

## 2023-09-10 DIAGNOSIS — Z23 Encounter for immunization: Secondary | ICD-10-CM

## 2023-09-10 DIAGNOSIS — Z3A29 29 weeks gestation of pregnancy: Secondary | ICD-10-CM

## 2023-09-10 DIAGNOSIS — O0933 Supervision of pregnancy with insufficient antenatal care, third trimester: Secondary | ICD-10-CM | POA: Diagnosis not present

## 2023-09-10 DIAGNOSIS — Z3480 Encounter for supervision of other normal pregnancy, unspecified trimester: Secondary | ICD-10-CM

## 2023-09-10 NOTE — Progress Notes (Signed)
   PRENATAL VISIT NOTE  Subjective:  Alison Martin is a 28 y.o. G3P2002 at [redacted]w[redacted]d being seen today for ongoing prenatal care.  She is currently monitored for the following issues for this low-risk pregnancy and has Supervision of other normal pregnancy, antepartum and Late prenatal care on their problem list.  Patient reports pelvic pressure/vaginal pain.  Contractions: Not present. Vag. Bleeding: None.  Movement: Present. Denies leaking of fluid.   The following portions of the patient's history were reviewed and updated as appropriate: allergies, current medications, past family history, past medical history, past social history, past surgical history and problem list.   Objective:    Vitals:   09/10/23 0856  BP: 103/65  Pulse: 76  Weight: 210 lb (95.3 kg)    Fetal Status:      Movement: Present    General: Alert, oriented and cooperative. Patient is in no acute distress.  Skin: Skin is warm and dry. No rash noted.   Cardiovascular: Normal heart rate noted  Respiratory: Normal respiratory effort, no problems with respiration noted  Abdomen: Soft, gravid, appropriate for gestational age.  Pain/Pressure: Present     Pelvic: Cervical exam deferred        Extremities: Normal range of motion.  Edema: Trace  Mental Status: Normal mood and affect. Normal behavior. Normal judgment and thought content.   Assessment and Plan:  Pregnancy: G3P2002 at [redacted]w[redacted]d 1. Supervision of other normal pregnancy, antepartum (Primary) Third tri labs today  FHR and FH wnl - Glucose Tolerance, 2 Hours w/1 Hour - CBC - HIV antibody (with reflex) - RPR  2. Late prenatal care  3. [redacted] weeks gestation of pregnancy Repeat growth due to hx of large babies (9+lbs)  Preterm labor symptoms and general obstetric precautions including but not limited to vaginal bleeding, contractions, leaking of fluid and fetal movement were reviewed in detail with the patient. Please refer to After Visit Summary for other  counseling recommendations.   No follow-ups on file.  Future Appointments  Date Time Provider Department Center  09/27/2023  8:00 AM [facility] PROVIDER 1 [facility]  09/27/2023  8:30 AM [facility] US2 [facility]  09/27/2023 11:15 AM Edstrom, Joesph LABOR, PA [facility] None    Carter Quarry, MD

## 2023-09-10 NOTE — Progress Notes (Signed)
 Pt presents for ROB visit. Requesting Tdap today.

## 2023-09-11 ENCOUNTER — Ambulatory Visit: Payer: Self-pay | Admitting: Obstetrics and Gynecology

## 2023-09-11 DIAGNOSIS — D509 Iron deficiency anemia, unspecified: Secondary | ICD-10-CM | POA: Insufficient documentation

## 2023-09-11 LAB — CBC
Hematocrit: 34.9 % (ref 34.0–46.6)
Hemoglobin: 10.7 g/dL — ABNORMAL LOW (ref 11.1–15.9)
MCH: 24.4 pg — ABNORMAL LOW (ref 26.6–33.0)
MCHC: 30.7 g/dL — ABNORMAL LOW (ref 31.5–35.7)
MCV: 80 fL (ref 79–97)
Platelets: 293 x10E3/uL (ref 150–450)
RBC: 4.38 x10E6/uL (ref 3.77–5.28)
RDW: 14.5 % (ref 11.7–15.4)
WBC: 11.5 x10E3/uL — ABNORMAL HIGH (ref 3.4–10.8)

## 2023-09-11 LAB — GLUCOSE TOLERANCE, 2 HOURS W/ 1HR
Glucose, 1 hour: 161 mg/dL (ref 70–179)
Glucose, 2 hour: 123 mg/dL (ref 70–152)
Glucose, Fasting: 84 mg/dL (ref 70–91)

## 2023-09-11 LAB — RPR: RPR Ser Ql: NONREACTIVE

## 2023-09-11 LAB — HIV ANTIBODY (ROUTINE TESTING W REFLEX): HIV Screen 4th Generation wRfx: NONREACTIVE

## 2023-09-27 ENCOUNTER — Ambulatory Visit: Payer: MEDICAID

## 2023-09-27 ENCOUNTER — Encounter: Admitting: Family Medicine

## 2023-09-27 ENCOUNTER — Ambulatory Visit

## 2023-09-27 DIAGNOSIS — Z3480 Encounter for supervision of other normal pregnancy, unspecified trimester: Secondary | ICD-10-CM

## 2023-10-10 ENCOUNTER — Ambulatory Visit (HOSPITAL_BASED_OUTPATIENT_CLINIC_OR_DEPARTMENT_OTHER): Payer: MEDICAID

## 2023-10-10 ENCOUNTER — Ambulatory Visit: Payer: MEDICAID | Attending: Obstetrics and Gynecology | Admitting: Obstetrics and Gynecology

## 2023-10-10 ENCOUNTER — Ambulatory Visit (INDEPENDENT_AMBULATORY_CARE_PROVIDER_SITE_OTHER): Admitting: Obstetrics and Gynecology

## 2023-10-10 VITALS — BP 104/70 | HR 92 | Wt 215.0 lb

## 2023-10-10 DIAGNOSIS — Z3483 Encounter for supervision of other normal pregnancy, third trimester: Secondary | ICD-10-CM

## 2023-10-10 DIAGNOSIS — Z362 Encounter for other antenatal screening follow-up: Secondary | ICD-10-CM | POA: Diagnosis present

## 2023-10-10 DIAGNOSIS — D509 Iron deficiency anemia, unspecified: Secondary | ICD-10-CM

## 2023-10-10 DIAGNOSIS — Z3A33 33 weeks gestation of pregnancy: Secondary | ICD-10-CM | POA: Diagnosis not present

## 2023-10-10 DIAGNOSIS — O99019 Anemia complicating pregnancy, unspecified trimester: Secondary | ICD-10-CM

## 2023-10-10 DIAGNOSIS — O0930 Supervision of pregnancy with insufficient antenatal care, unspecified trimester: Secondary | ICD-10-CM | POA: Diagnosis not present

## 2023-10-10 DIAGNOSIS — Z3009 Encounter for other general counseling and advice on contraception: Secondary | ICD-10-CM

## 2023-10-10 DIAGNOSIS — Z3480 Encounter for supervision of other normal pregnancy, unspecified trimester: Secondary | ICD-10-CM

## 2023-10-10 NOTE — Progress Notes (Signed)
   PRENATAL VISIT NOTE  Subjective:  Alison Martin is a 28 y.o. G3P2002 at [redacted]w[redacted]d being seen today for ongoing prenatal care.  She is currently monitored for the following issues for this low-risk pregnancy and has Supervision of other normal pregnancy, antepartum; Late prenatal care; and Iron deficiency anemia during pregnancy on their problem list.  Patient reports no complaints.  Contractions: Not present. Vag. Bleeding: None.  Movement: Present. Denies leaking of fluid.   The following portions of the patient's history were reviewed and updated as appropriate: allergies, current medications, past family history, past medical history, past social history, past surgical history and problem list.   Objective:   Vitals:   10/10/23 1550  BP: 104/70  Pulse: 92  Weight: 215 lb (97.5 kg)   Body mass index is 32.69 kg/m. Total weight gain: 25 lb (11.3 kg)   Fetal Status: Fetal Heart Rate (bpm): 130 Fundal Height: 33 cm Movement: Present     General:  Alert, oriented and cooperative. Patient is in no acute distress.  Skin: Skin is warm and dry. No rash noted.   Cardiovascular: Normal heart rate noted  Respiratory: Normal respiratory effort, no problems with respiration noted  Abdomen: Soft, gravid, appropriate for gestational age.  Pain/Pressure: Absent     Pelvic: Cervical exam deferred        Extremities: Normal range of motion.     Mental Status: Normal mood and affect. Normal behavior. Normal judgment and thought content.   Assessment and Plan:  Pregnancy: G3P2002 at [redacted]w[redacted]d 1. Supervision of other normal pregnancy, antepartum (Primary) Doing well, anticipatory guidance Growth ultrasound today 9/25 EFW 70%, AC 95%  2. Late prenatal care   3. Iron deficiency anemia during pregnancy Continue iron  4. Contraceptive education Patient considering IUD vs nexplanon , reviewed in detail & handouts given  Preterm labor symptoms and general obstetric precautions including but not  limited to vaginal bleeding, contractions, leaking of fluid and fetal movement were reviewed in detail with the patient. Please refer to After Visit Summary for other counseling recommendations.   Return in about 2 weeks (around 10/24/2023).  Future Appointments  Date Time Provider Department Center  10/25/2023  8:55 AM Rudy Carlin LABOR, MD [facility] None    Rollo ONEIDA Bring, MD

## 2023-10-10 NOTE — Progress Notes (Signed)
 Maternal-Fetal Medicine Consultation  Name: Alison Martin  MRN: [id number]  GA: [id number] [redacted]w[redacted]d   Patient is here for fetal growth assessment and estimation of fetal weight.  She delivered 2 babies weighing over 9 pounds.  Patient does not have gestational diabetes.  Ultrasound on today's ultrasound, amniotic fluid is normal and good fetal activity seen.  Fetal growth is appropriate for gestational age.  The estimated fetal weight is at the [facility] and the abdominal circumference measurement at the 95th percentile.   I reassured the patient of the findings.  I explained that ultrasound has limitations in accurately estimating fetal weights and that actual birth weight cannot be predicted. In the absence of gestational diabetes and good obstetric history, the likelihood of complications is not increased.  Recommendations - No follow-up appointments were made.     Consultation including face-to-face (more than 50%) counseling 10 minutes.

## 2023-10-25 ENCOUNTER — Other Ambulatory Visit (HOSPITAL_COMMUNITY)
Admission: RE | Admit: 2023-10-25 | Discharge: 2023-10-25 | Disposition: A | Source: Ambulatory Visit | Attending: Obstetrics | Admitting: Obstetrics

## 2023-10-25 ENCOUNTER — Encounter: Payer: Self-pay | Admitting: Obstetrics

## 2023-10-25 ENCOUNTER — Ambulatory Visit (INDEPENDENT_AMBULATORY_CARE_PROVIDER_SITE_OTHER): Admitting: Obstetrics

## 2023-10-25 VITALS — BP 108/66 | HR 77 | Wt 218.3 lb

## 2023-10-25 DIAGNOSIS — Z3A36 36 weeks gestation of pregnancy: Secondary | ICD-10-CM | POA: Diagnosis not present

## 2023-10-25 DIAGNOSIS — Z3480 Encounter for supervision of other normal pregnancy, unspecified trimester: Secondary | ICD-10-CM

## 2023-10-25 DIAGNOSIS — O0930 Supervision of pregnancy with insufficient antenatal care, unspecified trimester: Secondary | ICD-10-CM

## 2023-10-25 DIAGNOSIS — O99019 Anemia complicating pregnancy, unspecified trimester: Secondary | ICD-10-CM

## 2023-10-25 DIAGNOSIS — D509 Iron deficiency anemia, unspecified: Secondary | ICD-10-CM

## 2023-10-25 NOTE — Progress Notes (Signed)
 Subjective:  Alison Martin is a 28 y.o. G3P2002 at [redacted]w[redacted]d being seen today for ongoing prenatal care.  She is currently monitored for the following issues for this low-risk pregnancy and has Supervision of other normal pregnancy, antepartum; Late prenatal care; and Iron deficiency anemia during pregnancy on their problem list.  Patient reports no complaints.  Contractions: Not present. Vag. Bleeding: None.  Movement: Present. Denies leaking of fluid.   The following portions of the patient's history were reviewed and updated as appropriate: allergies, current medications, past family history, past medical history, past social history, past surgical history and problem list. Problem list updated.  Objective:   Vitals:   10/25/23 0852  BP: 108/66  Pulse: 77  Weight: 218 lb 4.8 oz (99 kg)    Fetal Status: Fetal Heart Rate (bpm): 139   Movement: Present     General:  Alert, oriented and cooperative. Patient is in no acute distress.  Skin: Skin is warm and dry. No rash noted.   Cardiovascular: Normal heart rate noted  Respiratory: Normal respiratory effort, no problems with respiration noted  Abdomen: Soft, gravid, appropriate for gestational age. Pain/Pressure: Present     Pelvic:  Cervical exam deferred        Extremities: Normal range of motion.  Edema: None  Mental Status: Normal mood and affect. Normal behavior. Normal judgment and thought content.   Urinalysis:      Assessment and Plan:  Pregnancy: G3P2002 at [redacted]w[redacted]d  1. Supervision of other normal pregnancy, antepartum (Primary)  2. Late prenatal care  3. Iron deficiency anemia during pregnancy - taking iron   Preterm labor symptoms and general obstetric precautions including but not limited to vaginal bleeding, contractions, leaking of fluid and fetal movement were reviewed in detail with the patient. Please refer to After Visit Summary for other counseling recommendations.  Return in about 1 week (around 11/01/2023) for  ROB.   Rudy Carlin LABOR, MD 10/25/2023

## 2023-10-25 NOTE — Progress Notes (Signed)
 Pt presents for rob. Pt has no questions or concerns at this time.

## 2023-10-28 LAB — CERVICOVAGINAL ANCILLARY ONLY
Bacterial Vaginitis (gardnerella): POSITIVE — AB
Candida Glabrata: NEGATIVE
Candida Vaginitis: NEGATIVE
Chlamydia: NEGATIVE
Comment: NEGATIVE
Comment: NEGATIVE
Comment: NEGATIVE
Comment: NEGATIVE
Comment: NEGATIVE
Comment: NORMAL
Neisseria Gonorrhea: NEGATIVE
Trichomonas: NEGATIVE

## 2023-10-29 ENCOUNTER — Ambulatory Visit: Payer: Self-pay | Admitting: Obstetrics

## 2023-10-29 DIAGNOSIS — B9689 Other specified bacterial agents as the cause of diseases classified elsewhere: Secondary | ICD-10-CM

## 2023-10-29 LAB — CULTURE, BETA STREP (GROUP B ONLY): Strep Gp B Culture: NEGATIVE

## 2023-10-29 MED ORDER — METRONIDAZOLE 500 MG PO TABS: 500.0000 mg | ORAL_TABLET | Freq: Two times a day (BID) | ORAL | 2 refills | Status: DC

## 2023-11-01 ENCOUNTER — Ambulatory Visit: Admitting: Obstetrics

## 2023-11-01 ENCOUNTER — Encounter: Payer: Self-pay | Admitting: Obstetrics

## 2023-11-01 VITALS — BP 126/71 | HR 77 | Wt 217.4 lb

## 2023-11-01 DIAGNOSIS — Z3A37 37 weeks gestation of pregnancy: Secondary | ICD-10-CM | POA: Diagnosis not present

## 2023-11-01 DIAGNOSIS — D508 Other iron deficiency anemias: Secondary | ICD-10-CM | POA: Diagnosis not present

## 2023-11-01 DIAGNOSIS — O0930 Supervision of pregnancy with insufficient antenatal care, unspecified trimester: Secondary | ICD-10-CM

## 2023-11-01 DIAGNOSIS — Z3480 Encounter for supervision of other normal pregnancy, unspecified trimester: Secondary | ICD-10-CM

## 2023-11-01 MED ORDER — ACCRUFER 30 MG PO CAPS: 1.0000 | ORAL_CAPSULE | Freq: Two times a day (BID) | ORAL | 3 refills | Status: AC

## 2023-11-01 NOTE — Progress Notes (Signed)
 Pt presents for rob. Pt has no questions or concerns at this time.

## 2023-11-01 NOTE — Progress Notes (Signed)
 Subjective:  Alison Martin is a 28 y.o. G3P2002 at [redacted]w[redacted]d being seen today for ongoing prenatal care.  She is currently monitored for the following issues for this low-risk pregnancy and has Supervision of other normal pregnancy, antepartum; Late prenatal care; and Iron deficiency anemia during pregnancy on their problem list.  Patient reports no complaints.  Contractions: Irritability. Vag. Bleeding: None.  Movement: Present. Denies leaking of fluid.   The following portions of the patient's history were reviewed and updated as appropriate: allergies, current medications, past family history, past medical history, past social history, past surgical history and problem list. Problem list updated.  Objective:   Vitals:   11/01/23 0911  BP: 126/71  Pulse: 77  Weight: 217 lb 6.4 oz (98.6 kg)    Fetal Status: Fetal Heart Rate (bpm): 132   Movement: Present     General:  Alert, oriented and cooperative. Patient is in no acute distress.  Skin: Skin is warm and dry. No rash noted.   Cardiovascular: Normal heart rate noted  Respiratory: Normal respiratory effort, no problems with respiration noted  Abdomen: Soft, gravid, appropriate for gestational age. Pain/Pressure: Absent     Pelvic:  Cervical exam deferred        Extremities: Normal range of motion.  Edema: Trace  Mental Status: Normal mood and affect. Normal behavior. Normal judgment and thought content.   Urinalysis:      Assessment and Plan:  Pregnancy: G3P2002 at [redacted]w[redacted]d  1. Supervision of other normal pregnancy, antepartum (Primary)  2. Late prenatal care  3. Iron deficiency anemia secondary to inadequate dietary iron intake   - Ferric Maltol (ACCRUFER) 30 MG CAPS; Take 1 capsule (30 mg total) by mouth 2 (two) times daily before a meal. Take 2 hrs before, or 2 hrs after a meal.  Dispense: 60 capsule; Refill: 3    Term labor symptoms and general obstetric precautions including but not limited to vaginal bleeding, contractions,  leaking of fluid and fetal movement were reviewed in detail with the patient. Please refer to After Visit Summary for other counseling recommendations.   Return in about 1 week (around 11/08/2023) for [facility].   Rudy Carlin LABOR, MD 11/01/2023

## 2023-11-06 DIAGNOSIS — Z603 Acculturation difficulty: Secondary | ICD-10-CM | POA: Insufficient documentation

## 2023-11-06 NOTE — Progress Notes (Deleted)
   PRENATAL VISIT NOTE  Subjective:  Alison Martin is a 28 y.o. G3P2002 at [redacted]w[redacted]d being seen today for ongoing prenatal care.  She is currently monitored for the following issues for this low-risk pregnancy and has Supervision of other normal pregnancy, antepartum; Late prenatal care; Iron deficiency anemia during pregnancy; and Language barrier on their problem list.  Patient reports {sx:14538}.   .  .   . Denies leaking of fluid.   The following portions of the patient's history were reviewed and updated as appropriate: allergies, current medications, past family history, past medical history, past social history, past surgical history and problem list.   Objective:    There were no vitals filed for this visit.  Fetal Status:           General: Alert, oriented and cooperative. Patient is in no acute distress.  Skin: Skin is warm and dry. No rash noted.   Cardiovascular: Normal heart rate noted  Respiratory: Normal respiratory effort, no problems with respiration noted  Abdomen: Soft, gravid, appropriate for gestational age.        Pelvic: {Blank single:19197::Cervical exam performed in the presence of a chaperone,Cervical exam deferred}        Extremities: Normal range of motion.     Mental Status: Normal mood and affect. Normal behavior. Normal judgment and thought content.   Assessment and Plan:  Pregnancy: G3P2002 at [redacted]w[redacted]d 1. Supervision of other normal pregnancy, antepartum (Primary) Prenatal course reviewed BP, HR, FHR within normal limits Feeling regular FM   2. Iron deficiency anemia during pregnancy Continue PO ferric maltol 30 mg BID  3. Language barrier Due to language barrier, {in person, virtual:32750} interpreter was present during the history-taking, physical exam, and subsequent discussion with this patient.    4. [redacted] weeks gestation of pregnancy Weekly appointments until delivery  Term labor symptoms and general obstetric precautions including but not  limited to vaginal bleeding, contractions, leaking of fluid and fetal movement were reviewed in detail with the patient. Please refer to After Visit Summary for other counseling recommendations.   No follow-ups on file.  Future Appointments  Date Time Provider Department Center  11/08/2023  8:55 AM Wallace Joesph LABOR, PA [facility] None  11/15/2023 10:55 AM Nicholaus Jorene BRAVO, PA-C [facility] None  11/22/2023  8:55 AM Rudy Carlin LABOR, MD [facility] None    Joesph LABOR Wallace, [place]

## 2023-11-08 ENCOUNTER — Encounter: Admitting: Family Medicine

## 2023-11-08 DIAGNOSIS — Z3480 Encounter for supervision of other normal pregnancy, unspecified trimester: Secondary | ICD-10-CM

## 2023-11-08 DIAGNOSIS — Z603 Acculturation difficulty: Secondary | ICD-10-CM

## 2023-11-08 DIAGNOSIS — D509 Iron deficiency anemia, unspecified: Secondary | ICD-10-CM

## 2023-11-08 DIAGNOSIS — Z3A38 38 weeks gestation of pregnancy: Secondary | ICD-10-CM

## 2023-11-11 NOTE — Progress Notes (Signed)
 This encounter was created in error - please disregard.

## 2023-11-15 ENCOUNTER — Ambulatory Visit: Admitting: Physician Assistant

## 2023-11-15 VITALS — BP 130/71 | HR 98 | Wt 220.6 lb

## 2023-11-15 DIAGNOSIS — Z603 Acculturation difficulty: Secondary | ICD-10-CM

## 2023-11-15 DIAGNOSIS — O99019 Anemia complicating pregnancy, unspecified trimester: Secondary | ICD-10-CM | POA: Diagnosis not present

## 2023-11-15 DIAGNOSIS — Z3480 Encounter for supervision of other normal pregnancy, unspecified trimester: Secondary | ICD-10-CM

## 2023-11-15 DIAGNOSIS — O0930 Supervision of pregnancy with insufficient antenatal care, unspecified trimester: Secondary | ICD-10-CM

## 2023-11-15 DIAGNOSIS — D509 Iron deficiency anemia, unspecified: Secondary | ICD-10-CM

## 2023-11-15 DIAGNOSIS — Z3A39 39 weeks gestation of pregnancy: Secondary | ICD-10-CM

## 2023-11-15 DIAGNOSIS — Z758 Other problems related to medical facilities and other health care: Secondary | ICD-10-CM

## 2023-11-15 NOTE — Progress Notes (Signed)
   PRENATAL VISIT NOTE  Subjective:  Alison Martin is a 28 y.o. G3P2002 at [redacted]w[redacted]d being seen today for ongoing prenatal care.  She is currently monitored for the following issues for this low-risk pregnancy and has Supervision of other normal pregnancy, antepartum; Late prenatal care; Iron deficiency anemia during pregnancy; and Language barrier on their problem list.  Patient reports no complaints.  Contractions: Irritability. Vag. Bleeding: None.  Movement: Present. Denies leaking of fluid.   The following portions of the patient's history were reviewed and updated as appropriate: allergies, current medications, past family history, past medical history, past social history, past surgical history and problem list.   Objective:    Vitals:   11/15/23 1056  BP: 130/71  Pulse: 98  Weight: 220 lb 9.6 oz (100.1 kg)    Fetal Status:  Fetal Heart Rate (bpm): 140 Fundal Height: 38 cm Movement: Present    General: Alert, oriented and cooperative. Patient is in no acute distress.  Skin: Skin is warm and dry. No rash noted.   Cardiovascular: Normal heart rate noted  Respiratory: Normal respiratory effort, no problems with respiration noted  Abdomen: Soft, gravid, appropriate for gestational age.  Pain/Pressure: Present     Pelvic: Cervical exam deferred        Extremities: Normal range of motion.  Edema: Trace  Mental Status: Normal mood and affect. Normal behavior. Normal judgment and thought content.   Assessment and Plan:  Pregnancy: G3P2002 at [redacted]w[redacted]d  1. Supervision of other normal pregnancy, antepartum (Primary) Patient doing well, feeling regular fetal movement BP, FHR, FH appropriate   2. [redacted] weeks gestation of pregnancy Anticipatory guidance about next visits/weeks of pregnancy given.  Postdates induction scheduled 11/29/23  3. Late prenatal care  4. Iron deficiency anemia during pregnancy 09/10/23 hgb 10.7 Continue oral iron   5. Language barrier Spanish interpreter  utilized during encounter  Term labor symptoms and general obstetric precautions including but not limited to vaginal bleeding, contractions, leaking of fluid and fetal movement were reviewed in detail with the patient.  Please refer to After Visit Summary for other counseling recommendations.   Return in about 1 week (around 11/22/2023) for LOB.  Future Appointments  Date Time Provider Department Center  11/22/2023  8:55 AM Rudy Carlin LABOR, MD [facility] None  11/29/2023  7:15 AM [facility] SCHED ROOM [facility] None    Jorene FORBES Moats, PA-C

## 2023-11-15 NOTE — Progress Notes (Signed)
 Pt presents for ROB visit. No concerns

## 2023-11-22 ENCOUNTER — Encounter: Payer: Self-pay | Admitting: Obstetrics

## 2023-11-22 ENCOUNTER — Encounter (HOSPITAL_COMMUNITY): Payer: Self-pay | Admitting: *Deleted

## 2023-11-22 ENCOUNTER — Telehealth (HOSPITAL_COMMUNITY): Payer: Self-pay | Admitting: *Deleted

## 2023-11-22 ENCOUNTER — Ambulatory Visit: Admitting: Obstetrics

## 2023-11-22 VITALS — BP 124/71 | HR 75 | Wt 222.4 lb

## 2023-11-22 DIAGNOSIS — Z603 Acculturation difficulty: Secondary | ICD-10-CM

## 2023-11-22 DIAGNOSIS — O0930 Supervision of pregnancy with insufficient antenatal care, unspecified trimester: Secondary | ICD-10-CM

## 2023-11-22 DIAGNOSIS — O99013 Anemia complicating pregnancy, third trimester: Secondary | ICD-10-CM

## 2023-11-22 DIAGNOSIS — Z758 Other problems related to medical facilities and other health care: Secondary | ICD-10-CM

## 2023-11-22 DIAGNOSIS — O99019 Anemia complicating pregnancy, unspecified trimester: Secondary | ICD-10-CM | POA: Diagnosis not present

## 2023-11-22 DIAGNOSIS — Z3A40 40 weeks gestation of pregnancy: Secondary | ICD-10-CM

## 2023-11-22 DIAGNOSIS — D509 Iron deficiency anemia, unspecified: Secondary | ICD-10-CM

## 2023-11-22 DIAGNOSIS — Z3480 Encounter for supervision of other normal pregnancy, unspecified trimester: Secondary | ICD-10-CM

## 2023-11-22 NOTE — Telephone Encounter (Signed)
 [id number] interpreter number  Preadmission screen

## 2023-11-22 NOTE — Progress Notes (Signed)
 Subjective:  Alison Martin is a 28 y.o. G3P2002 at [redacted]w[redacted]d being seen today for ongoing prenatal care.  She is currently monitored for the following issues for this low-risk pregnancy and has Supervision of other normal pregnancy, antepartum; Late prenatal care; Iron deficiency anemia during pregnancy; and Language barrier on their problem list.  Patient reports no complaints.  Contractions: Not present. Vag. Bleeding: None.  Movement: Present. Denies leaking of fluid.   The following portions of the patient's history were reviewed and updated as appropriate: allergies, current medications, past family history, past medical history, past social history, past surgical history and problem list. Problem list updated.  Objective:   Vitals:   11/22/23 0859  BP: 124/71  Pulse: 75  Weight: 222 lb 6.4 oz (100.9 kg)    Fetal Status:     Movement: Present     General:  Alert, oriented and cooperative. Patient is in no acute distress.  Skin: Skin is warm and dry. No rash noted.   Cardiovascular: Normal heart rate noted  Respiratory: Normal respiratory effort, no problems with respiration noted  Abdomen: Soft, gravid, appropriate for gestational age. Pain/Pressure: Present     Pelvic:  Cervical exam deferred        Extremities: Normal range of motion.  Edema: Trace  Mental Status: Normal mood and affect. Normal behavior. Normal judgment and thought content.   Urinalysis:      Assessment and Plan:  Pregnancy: G3P2002 at [redacted]w[redacted]d  1. Supervision of other normal pregnancy, antepartum (Primary)  2. [redacted] weeks gestation of pregnancy - Rx: - Fetal nonstress test;  Reactive.  FHR = 140-155.  Good variability.  15x15 accels. No decels  3. Late prenatal care  4. Language barrier - interpreter present  5. Iron deficiency anemia during pregnancy - taking Accrufer   Term labor symptoms and general obstetric precautions including but not limited to vaginal bleeding, contractions, leaking of fluid and  fetal movement were reviewed in detail with the patient. Please refer to After Visit Summary for other counseling recommendations.   Return in about 4 weeks (around 12/20/2023) for postpartum visit.   Rudy Carlin LABOR, MD

## 2023-11-22 NOTE — Progress Notes (Signed)
 Pt presents for rob. Pt has no questions or concerns at this time.

## 2023-11-26 ENCOUNTER — Other Ambulatory Visit: Payer: Self-pay

## 2023-11-26 ENCOUNTER — Inpatient Hospital Stay (HOSPITAL_COMMUNITY)
Admission: AD | Admit: 2023-11-26 | Discharge: 2023-11-28 | DRG: 806 | Disposition: A | Discharge status: Home / Self Care | Attending: Obstetrics and Gynecology | Admitting: Obstetrics and Gynecology

## 2023-11-26 ENCOUNTER — Encounter (HOSPITAL_COMMUNITY): Payer: Self-pay | Admitting: Obstetrics and Gynecology

## 2023-11-26 ENCOUNTER — Encounter (HOSPITAL_COMMUNITY): Payer: Self-pay

## 2023-11-26 DIAGNOSIS — Z8249 Family history of ischemic heart disease and other diseases of the circulatory system: Secondary | ICD-10-CM | POA: Diagnosis not present

## 2023-11-26 DIAGNOSIS — O9081 Anemia of the puerperium: Secondary | ICD-10-CM | POA: Diagnosis not present

## 2023-11-26 DIAGNOSIS — Z3A40 40 weeks gestation of pregnancy: Secondary | ICD-10-CM

## 2023-11-26 DIAGNOSIS — Z87891 Personal history of nicotine dependence: Secondary | ICD-10-CM

## 2023-11-26 DIAGNOSIS — D62 Acute posthemorrhagic anemia: Secondary | ICD-10-CM | POA: Diagnosis not present

## 2023-11-26 DIAGNOSIS — O480 Post-term pregnancy: Principal | ICD-10-CM | POA: Diagnosis present

## 2023-11-26 DIAGNOSIS — Z555 Less than a high school diploma: Secondary | ICD-10-CM | POA: Diagnosis not present

## 2023-11-26 DIAGNOSIS — Z3480 Encounter for supervision of other normal pregnancy, unspecified trimester: Principal | ICD-10-CM

## 2023-11-26 DIAGNOSIS — O26893 Other specified pregnancy related conditions, third trimester: Secondary | ICD-10-CM | POA: Diagnosis present

## 2023-11-26 LAB — CBC
HCT: 36.5 % (ref 36.0–46.0)
Hemoglobin: 11.4 g/dL — ABNORMAL LOW (ref 12.0–15.0)
MCH: 23.3 pg — ABNORMAL LOW (ref 26.0–34.0)
MCHC: 31.2 g/dL (ref 30.0–36.0)
MCV: 74.5 fL — ABNORMAL LOW (ref 80.0–100.0)
Platelets: 317 K/uL (ref 150–400)
RBC: 4.9 MIL/uL (ref 3.87–5.11)
RDW: 20.5 % — ABNORMAL HIGH (ref 11.5–15.5)
WBC: 13 K/uL — ABNORMAL HIGH (ref 4.0–10.5)
nRBC: 0 % (ref 0.0–0.2)

## 2023-11-26 LAB — TYPE AND SCREEN
ABO/RH(D): O POS
Antibody Screen: NEGATIVE

## 2023-11-26 MED ORDER — PRENATAL MULTIVITAMIN CH
1.0000 | ORAL_TABLET | Freq: Every day | ORAL | Status: DC
  Administered 2023-11-27 – 2023-11-28 (×2): 1 via ORAL
  Filled 2023-11-26 (×2): qty 1

## 2023-11-26 MED ORDER — OXYCODONE-ACETAMINOPHEN 5-325 MG PO TABS: 1.0000 | ORAL_TABLET | ORAL | Status: DC | PRN

## 2023-11-26 MED ORDER — LIDOCAINE HCL (PF) 1 % IJ SOLN: 30.0000 mL | INTRAMUSCULAR | Status: DC | PRN

## 2023-11-26 MED ORDER — TRANEXAMIC ACID-NACL 1000-0.7 MG/100ML-% IV SOLN
INTRAVENOUS | Status: AC
  Filled 2023-11-26: qty 100

## 2023-11-26 MED ORDER — ACETAMINOPHEN 325 MG PO TABS: 650.0000 mg | ORAL_TABLET | ORAL | Status: DC | PRN

## 2023-11-26 MED ORDER — OXYCODONE HCL 5 MG PO TABS: 10.0000 mg | ORAL_TABLET | ORAL | Status: DC | PRN

## 2023-11-26 MED ORDER — LACTATED RINGERS IV SOLN: 500.0000 mL | INTRAVENOUS | Status: DC | PRN

## 2023-11-26 MED ORDER — BENZOCAINE-MENTHOL 20-0.5 % EX AERO: 1.0000 | INHALATION_SPRAY | CUTANEOUS | Status: DC | PRN

## 2023-11-26 MED ORDER — DIBUCAINE (PERIANAL) 1 % EX OINT: 1.0000 | TOPICAL_OINTMENT | CUTANEOUS | Status: DC | PRN

## 2023-11-26 MED ORDER — ONDANSETRON HCL 4 MG/2ML IJ SOLN: 4.0000 mg | INTRAMUSCULAR | Status: DC | PRN

## 2023-11-26 MED ORDER — SOD CITRATE-CITRIC ACID 500-334 MG/5ML PO SOLN: 30.0000 mL | ORAL | Status: DC | PRN

## 2023-11-26 MED ORDER — SIMETHICONE 80 MG PO CHEW: 80.0000 mg | CHEWABLE_TABLET | ORAL | Status: DC | PRN

## 2023-11-26 MED ORDER — SENNOSIDES-DOCUSATE SODIUM 8.6-50 MG PO TABS
2.0000 | ORAL_TABLET | Freq: Every day | ORAL | Status: DC
  Administered 2023-11-27 – 2023-11-28 (×2): 2 via ORAL
  Filled 2023-11-26 (×2): qty 2

## 2023-11-26 MED ORDER — ONDANSETRON HCL 4 MG PO TABS: 4.0000 mg | ORAL_TABLET | ORAL | Status: DC | PRN

## 2023-11-26 MED ORDER — ONDANSETRON HCL 4 MG/2ML IJ SOLN: 4.0000 mg | Freq: Four times a day (QID) | INTRAMUSCULAR | Status: DC | PRN

## 2023-11-26 MED ORDER — DIPHENHYDRAMINE HCL 25 MG PO CAPS: 25.0000 mg | ORAL_CAPSULE | Freq: Four times a day (QID) | ORAL | Status: DC | PRN

## 2023-11-26 MED ORDER — LACTATED RINGERS IV SOLN: INTRAVENOUS | Status: DC

## 2023-11-26 MED ORDER — IBUPROFEN 600 MG PO TABS
600.0000 mg | ORAL_TABLET | Freq: Four times a day (QID) | ORAL | Status: DC
  Administered 2023-11-26 – 2023-11-28 (×8): 600 mg via ORAL
  Filled 2023-11-26 (×8): qty 1

## 2023-11-26 MED ORDER — OXYCODONE-ACETAMINOPHEN 5-325 MG PO TABS: 2.0000 | ORAL_TABLET | ORAL | Status: DC | PRN

## 2023-11-26 MED ORDER — WITCH HAZEL-GLYCERIN EX PADS: 1.0000 | MEDICATED_PAD | CUTANEOUS | Status: DC | PRN

## 2023-11-26 MED ORDER — OXYCODONE HCL 5 MG PO TABS
5.0000 mg | ORAL_TABLET | ORAL | Status: DC | PRN
  Filled 2023-11-26: qty 1

## 2023-11-26 MED ORDER — FENTANYL CITRATE (PF) 100 MCG/2ML IJ SOLN: 100.0000 ug | Freq: Once | INTRAMUSCULAR | Status: AC

## 2023-11-26 MED ORDER — FENTANYL CITRATE (PF) 100 MCG/2ML IJ SOLN
INTRAMUSCULAR | Status: AC
  Administered 2023-11-26: 100 ug
  Filled 2023-11-26: qty 2

## 2023-11-26 MED ORDER — COCONUT OIL OIL
1.0000 | TOPICAL_OIL | Status: DC | PRN
  Administered 2023-11-27: 1 via TOPICAL

## 2023-11-26 MED ORDER — TRANEXAMIC ACID-NACL 1000-0.7 MG/100ML-% IV SOLN
1000.0000 mg | INTRAVENOUS | Status: AC
  Administered 2023-11-26: 1000 mg via INTRAVENOUS

## 2023-11-26 MED ORDER — OXYTOCIN BOLUS FROM INFUSION
333.0000 mL | Freq: Once | INTRAVENOUS | Status: AC
  Administered 2023-11-26: 333 mL via INTRAVENOUS

## 2023-11-26 MED ORDER — OXYTOCIN-SODIUM CHLORIDE 30-0.9 UT/500ML-% IV SOLN
2.5000 [IU]/h | INTRAVENOUS | Status: DC
  Filled 2023-11-26: qty 500

## 2023-11-26 NOTE — H&P (Cosign Needed Addendum)
 OBSTETRIC ADMISSION HISTORY AND PHYSICAL  Alison Martin is a 28 y.o. female G3P2002 with IUP at [redacted]w[redacted]d by LMP exact date, presenting with active labor. She reports +FMs, No LOF, no VB, no blurry vision, headaches or peripheral edema, and RUQ pain.  She plans on breast feeding. She request outpatient Nexplanon  vs IUD for birth control. She received her prenatal care at [facility]   She presented to MAU dilated to 8cm with a bulging bag.  Dating: By LMP, exact date --->  Estimated Date of Delivery: 11/22/23  Sono:    @[redacted]w[redacted]d , CWD, normal anatomy, cephalic presentation,  2502g, 70% EFW   Prenatal History/Complications: IDA during pregnancy  Past Medical History: Past Medical History:  Diagnosis Date   Chlamydia infection affecting pregnancy in third trimester 03/22/2021   Chlamydia trachomatis infection in pregnancy in third trimester 10/15/2019   + 05/2019 + 07/08/19 (had not picked up medication)  Confirmed treatment in early August TOC 10/15/19 + Retreated, negative TOC on 10/27   History of cholecystitis 03/15/2021   Controlling with dietary changes     Indication for care in labor and delivery, antepartum 06/07/2021   Limited prenatal care 06/07/2021   First visit @ 29wks     Medical history non-contributory     Past Surgical History: Past Surgical History:  Procedure Laterality Date   NO PAST SURGERIES      Obstetrical History: OB History     Gravida  3   Para  2   Term  2   Preterm      AB      Living  2      SAB      IAB      Ectopic      Multiple  0   Live Births  2           Social History Social History   Socioeconomic History   Marital status: Significant Other    Spouse name: Not on file   Number of children: Not on file   Years of education: Not on file   Highest education level: 9th grade  Occupational History   Not on file  Tobacco Use   Smoking status: Former    Current packs/day: 0.00    Types: Cigarettes    Quit date: 2018     Years since quitting: 7.8   Smokeless tobacco: Never   Tobacco comments:    stopped about year ago  Vaping Use   Vaping status: Never Used  Substance and Sexual Activity   Alcohol use: Not Currently    Comment: socially, none in the last year   Drug use: Never   Sexual activity: Not Currently    Birth control/protection: None  Other Topics Concern   Not on file  Social History Narrative   Not on file   Social Drivers of Health   Financial Resource Strain: Not on file  Food Insecurity: No Food Insecurity (11/26/2023)   Hunger Vital Sign    Worried About Running Out of Food in the Last Year: Never true    Ran Out of Food in the Last Year: Never true  Transportation Needs: No Transportation Needs (11/26/2023)   PRAPARE - Administrator, Civil Service (Medical): No    Lack of Transportation (Non-Medical): No  Physical Activity: Not on file  Stress: Not on file  Social Connections: Not on file    Family History: Family History  Problem Relation Age of Onset   Hypertension Mother  Allergies: No Known Allergies  Medications Prior to Admission  Medication Sig Dispense Refill Last Dose/Taking   Ferric Maltol (ACCRUFER) 30 MG CAPS Take 1 capsule (30 mg total) by mouth 2 (two) times daily before a meal. Take 2 hrs before, or 2 hrs after a meal. 60 capsule 3 11/25/2023   Prenatal Vit-Fe Fumarate-FA (MULTIVITAMIN-PRENATAL) 27-0.8 MG TABS tablet Take 1 tablet by mouth daily at 12 noon.   11/25/2023   metroNIDAZOLE  (FLAGYL ) 500 MG tablet Take 1 tablet (500 mg total) by mouth 2 (two) times daily. (Patient not taking: Reported on 11/22/2023) 14 tablet 2      Review of Systems   All systems reviewed and negative except as stated in HPI  Blood pressure 114/69, pulse 87, temperature 98.2 F (36.8 C), temperature source Oral, resp. rate 17, height 5' 9 (1.753 m), weight 101 kg, last menstrual period 02/15/2023, SpO2 99%, unknown if currently breastfeeding. General  appearance: alert, cooperative, fatigued, and mild distress Lungs: clear to auscultation bilaterally Heart: regular rate and rhythm Abdomen: soft, non-tender; bowel sounds normal Pelvic: cervical exam below Extremities: no LE edema Presentation: cephalic Fetal monitoringBaseline: 135 bpm, Variability: Good {> 6 bpm), Accelerations: Reactive, and Decelerations: Absent Uterine activityDate/time of onset: since 3-4 AM, Frequency: Every 2-3 minutes, Duration: 60-70 seconds, and Intensity: strong Dilation: 10 Effacement (%): 100 Station: -1 Exam by:: Jolynn frizzell, RN   Prenatal labs: ABO, Rh: --/--/O POS (11/11 1333) Antibody: NEG (11/11 1333) Rubella: 1.49 (05/30 1016) RPR: Non Reactive (08/26 0848)  HBsAg: Negative (05/30 1016)  HIV: Non Reactive (08/26 0848)  GBS: Negative/-- (10/10 1115)    Lab Results  Component Value Date   GBS Negative 10/25/2023   GTT 2 hour normal Genetic screening  LR Female, negative Anatomy US  normal  Immunization History  Administered Date(s) Administered   Influenza,inj,Quad PF,6+ Mos 11/13/2019   Tdap 11/13/2019, 06/09/2021, 09/10/2023    Prenatal Transfer Tool  Maternal Diabetes: No Genetic Screening: Normal Maternal Ultrasounds/Referrals: Normal Fetal Ultrasounds or other Referrals:  None Maternal Substance Abuse:  No Significant Maternal Medications:  None Significant Maternal Lab Results: Group B Strep negative and Other:  blood type pending Number of Prenatal Visits:greater than 3 verified prenatal visits Maternal Vaccinations:TDap and Covid Other Comments:  None   Results for orders placed or performed during the hospital encounter of 11/26/23 (from the past 24 hours)  CBC   Collection Time: 11/26/23  1:33 PM  Result Value Ref Range   WBC 13.0 (H) 4.0 - 10.5 K/uL   RBC 4.90 3.87 - 5.11 MIL/uL   Hemoglobin 11.4 (L) 12.0 - 15.0 g/dL   HCT 63.4 63.9 - 53.9 %   MCV 74.5 (L) 80.0 - 100.0 fL   MCH 23.3 (L) 26.0 - 34.0 pg   MCHC  31.2 30.0 - 36.0 g/dL   RDW 79.4 (H) 88.4 - 84.4 %   Platelets 317 150 - 400 K/uL   nRBC 0.0 0.0 - 0.2 %  Type and screen [facility]   Collection Time: 11/26/23  1:33 PM  Result Value Ref Range   ABO/RH(D) O POS    Antibody Screen NEG    Sample Expiration      11/29/2023,2359 Performed at [facility], [address], [place], [place] [address]     Patient Active Problem List   Diagnosis Date Noted   Normal labor 11/26/2023   Language barrier 11/06/2023   Iron deficiency anemia during pregnancy 09/11/2023   Late prenatal care 06/14/2023  Supervision of other normal pregnancy, antepartum 06/12/2023    Assessment/Plan:  Malayna Noori is a 28 y.o. G3P2002 at [redacted]w[redacted]d here for active labor  #Labor: AROM per patient request, expectant management #Pain: Desires un-medicated delivery #FWB: Category 1 #GBS status:  negative #Feeding: Breastmilk  #Reproductive Life planning: outpatient Nexplanon  vs IUD, still undecided #Circ:  not applicable  Fairy Amy, MD  11/26/2023, 3:46 PM  GME ATTESTATION:  Evaluation and management procedures were performed by the [facility] Medicine Resident under my supervision. I was immediately available for direct supervision, assistance and direction throughout this encounter.  I also confirm that I have verified the information documented in the resident's note, and that I have also personally reperformed the pertinent components of the physical exam and all of the medical decision making activities.  I have also made any necessary editorial changes.  Leeroy KATHEE Pouch, MD OB Fellow, Faculty Practice [facility], Center for [facility] 11/26/2023 8:18 PM

## 2023-11-26 NOTE — MAU Note (Signed)
 Alison Martin is a 28 y.o. at [redacted]w[redacted]d here in MAU reporting:   Ctxs started at 0300 this morning. They were about 6 min apart now they are about 4-5 min apart.  No lof, no bleeding. +Fm.   Does not desire epidural.  Babygirl.  Vitals:   11/26/23 1326  BP: 112/71  Pulse: 94  Resp: 17  Temp: 98.8 F (37.1 C)  SpO2: 99%      Pain score: 4/10 lower abdominal ctxs   FHT:  Lab orders placed from triage: labor eval

## 2023-11-26 NOTE — Plan of Care (Signed)

## 2023-11-26 NOTE — Discharge Summary (Shared)
 Postpartum Discharge Summary  Date of Service updated***     Patient Name: Alison Martin DOB: [birth year] MRN: [id number]  Date of admission: 11/26/2023 Delivery date:11/26/2023 Delivering provider: TRUDY CZAR B Date of discharge: 11/26/2023  Admitting diagnosis: Normal labor [O80, Z37.9] Intrauterine pregnancy: [redacted]w[redacted]d     Secondary diagnosis:  Active Problems:   SVD (spontaneous vaginal delivery)   Postpartum hemorrhage  Additional problems: none    Discharge diagnosis: Term Pregnancy Delivered and PPH                                              Post partum procedures:{Postpartum procedures:23558} Augmentation: AROM Complications: Hemorrhage>1016mL  Hospital course: Onset of Labor With Vaginal Delivery      28 y.o. yo H6E7997 at [redacted]w[redacted]d was admitted in Active Labor on 11/26/2023. Labor course was uncomplicated. There was a large quantity of blood lost prior to and with delivery of placenta. Uterine tone was noted to be firm, but she had an additional large gush of blood with uterine massage. No bleeding lacerations noted. 1g TXA given.  Membrane Rupture Time/Date: 2:58 PM,11/26/2023  Delivery Method:Vaginal, Spontaneous Operative Delivery:N/A Episiotomy: None Lacerations:  None Patient had a postpartum course complicated by ***.  She is ambulating, tolerating a regular diet, passing flatus, and urinating well. Patient is discharged home in stable condition on 11/26/23.  Newborn Data: Birth date:11/26/2023 Birth time:3:24 PM Gender:Female Living status:Living Apgars: ,  Weight:   Magnesium Sulfate received: No BMZ received: No Rhophylac:N/A MMR:N/A T-DaP:Given prenatally Flu: {Qol:76036} RSV Vaccine received: {RSV:31013} Transfusion:{Transfusion received:30440034}  Immunizations received: Immunization History  Administered Date(s) Administered   Influenza,inj,Quad PF,6+ Mos 11/13/2019   Tdap 11/13/2019, 06/09/2021, 09/10/2023    Physical exam   Vitals:   11/26/23 1320 11/26/23 1326 11/26/23 1343  BP:  112/71 114/69  Pulse:  94 87  Resp:  17   Temp:  98.8 F (37.1 C) 98.2 F (36.8 C)  TempSrc:  Oral Oral  SpO2:  99%   Weight: 101 kg    Height: 5' 9 (1.753 m)     General: {Exam; general:21111117} Lochia: {Desc; appropriate/inappropriate:30686::appropriate} Uterine Fundus: {Desc; firm/soft:30687} Incision: {Exam; incision:21111123} DVT Evaluation: {Exam; dvt:2111122} Labs: Lab Results  Component Value Date   WBC 13.0 (H) 11/26/2023   HGB 11.4 (L) 11/26/2023   HCT 36.5 11/26/2023   MCV 74.5 (L) 11/26/2023   PLT 317 11/26/2023      Latest Ref Rng & Units 03/15/2021    2:36 PM  CMP  Glucose 70 - 99 mg/dL 70   BUN 6 - 20 mg/dL 8   Creatinine 9.42 - 8.99 mg/dL 9.54   Sodium 865 - 855 mmol/L 138   Potassium 3.5 - 5.2 mmol/L 3.7   Chloride 96 - 106 mmol/L 102   CO2 20 - 29 mmol/L 21   Calcium 8.7 - 10.2 mg/dL 9.8   Total Protein 6.0 - 8.5 g/dL 6.7   Total Bilirubin 0.0 - 1.2 mg/dL 0.4   Alkaline Phos 44 - 121 IU/L 79   AST 0 - 40 IU/L 12   ALT 0 - 32 IU/L 8    Edinburgh Score:    06/08/2021    5:55 PM  Edinburgh Postnatal Depression Scale Screening Tool  I have been able to laugh and see the funny side of things. 1   I have looked forward with enjoyment to  things. 1   I have blamed myself unnecessarily when things went wrong. 0   I have been anxious or worried for no good reason. 2   I have felt scared or panicky for no good reason. 0   Things have been getting on top of me. 0   I have been so unhappy that I have had difficulty sleeping. 0   I have felt sad or miserable. 1   I have been so unhappy that I have been crying. 0   The thought of harming myself has occurred to me. 0   Edinburgh Postnatal Depression Scale Total 5      Data saved with a previous flowsheet row definition   No data recorded  After visit meds:  Allergies as of 11/26/2023   No Known Allergies   Med Rec must be completed  prior to using this [facility]***        Discharge home in stable condition Infant Feeding: Breast Infant Disposition:home with mother Discharge instruction: per After Visit Summary and Postpartum booklet. Activity: Advance as tolerated. Pelvic rest for 6 weeks.  Diet: routine diet Future Appointments: Future Appointments  Date Time Provider Department Center  12/27/2023  9:15 AM Wallace Joesph LABOR, PA [facility] None   Follow up Visit: Note sent to [facility] 11/26/23  Please schedule this patient for a In person postpartum visit in 6 weeks with the following provider: Any provider. Additional Postpartum F/U:none  Low risk pregnancy complicated by: PPH ( ) Delivery mode:  Vaginal, Spontaneous Anticipated Birth Control:  IUD and Nexplanon    11/26/2023 Leeroy KATHEE Pouch, MD

## 2023-11-26 NOTE — Lactation Note (Signed)
 This note was copied from a baby's chart. Lactation Consultation Note  Patient Name: Alison Martin Date: 11/26/2023 Age:28 hours Reason for consult: Initial assessment;Term.See MR: PPH and anemia  In house Spanish Interpreter used # Ana  P3, Per [name], she feels infant is latching well, she recently breastfeed for 15 minutes at 2200 pm prior to [facility] entering the room, [name] feeds have been 10 -15 minutes. [name] is experienced with breastfeeding see maternal data below. [name] will continue to breastfeed infant by cues, on demand, 8-12 times within 24 hours, skin to skin. [name] knows to call for latch assistance if needed. LC discussed maternal rest, meals and hydration. LC gave hand pump for PRN home use. [name] was made aware of O/P services, breastfeeding support groups, community resources, and our phone # for post-discharge questions.    Maternal Data Does the patient have breastfeeding experience prior to this delivery?: Yes How long did the patient breastfeed?: Per [name], she breastfeed her 1st child for 4 months and 2nd child for 15 months  Feeding Mother's Current Feeding Choice: Breast Milk  LATCH Score   LC did not observe latch due to infant recently breastfeeding prior to [facility] entering the room.                  Lactation Tools Discussed/Used Tools: Pump;Flanges Flange Size: 18 Breast pump type: Manual Pump Education: Setup, frequency, and cleaning;Milk Storage Reason for Pumping: Home PRN  Interventions Interventions: Breast feeding basics reviewed;Skin to skin;Hand pump;Education;Guidelines for Milk Supply and Pumping Schedule Handout;LC Services brochure;CDC milk storage guidelines;CDC Guidelines for Breast Pump Cleaning  Discharge Pump: Manual ([name] was given hand pump by [facility] services for home use.)  Consult Status      Alison Martin 11/26/2023, 10:31 PM

## 2023-11-27 LAB — CBC
HCT: 27.7 % — ABNORMAL LOW (ref 36.0–46.0)
Hemoglobin: 8.7 g/dL — ABNORMAL LOW (ref 12.0–15.0)
MCH: 23.7 pg — ABNORMAL LOW (ref 26.0–34.0)
MCHC: 31.4 g/dL (ref 30.0–36.0)
MCV: 75.5 fL — ABNORMAL LOW (ref 80.0–100.0)
Platelets: 264 K/uL (ref 150–400)
RBC: 3.67 MIL/uL — ABNORMAL LOW (ref 3.87–5.11)
RDW: 19.8 % — ABNORMAL HIGH (ref 11.5–15.5)
WBC: 16.6 K/uL — ABNORMAL HIGH (ref 4.0–10.5)
nRBC: 0 % (ref 0.0–0.2)

## 2023-11-27 LAB — RPR: RPR Ser Ql: NONREACTIVE

## 2023-11-27 MED ORDER — SODIUM CHLORIDE 0.9 % IV SOLN
500.0000 mg | Freq: Once | INTRAVENOUS | Status: AC
  Administered 2023-11-27: 500 mg via INTRAVENOUS
  Filled 2023-11-27: qty 25

## 2023-11-27 MED ORDER — IBUPROFEN 600 MG PO TABS: 600.0000 mg | ORAL_TABLET | Freq: Four times a day (QID) | ORAL | 0 refills | Status: DC

## 2023-11-27 NOTE — Patient Instructions (Signed)
 If you are interested in an outpatient lactation consultation -- available in-office or virtually -- please reach out to us  at:  [facility] (First Floor) ?? [address], [place], [place]  ?? [phone] Please leave a message on our lactation voicemail box. We welcome any lactation-related questions or concerns -- our team is here to support you and your baby.  Lactation Support Groups Join us  at: [facility] ?? Tuesdays, 10:00 AM - 12:00 PM ?? [address], [employer], [employer]  Lactating parents and lap babies are welcome!  ?? [email or web address]  ?? [email or web address] -------------  Si est interesado en una consulta ambulatoria de lactancia, disponible en el consultorio o virtualmente, comunquese con nosotros en:  [facility] ([facility]) ?? [address], [place], [place]  ?? [phone] Por favor, deje un mensaje en nuestro buzn de voz de lactancia. Estamos aqu para responder cualquier pregunta o inquietud relacionada con la lactancia y para apoyarle a usted y a su beb.  Grupos de Apoyo para la Lactancia nase a nosotros en: [facility] ?? Martes, de 10:00 a. m. a 12:00 p. m. ?? [address], [place], [place]  Se admiten madres lactantes y bebs en regazo.  ?? [email or web address]  ?? [email or web address]      Alison Martin, [facility] Center for [facility]

## 2023-11-27 NOTE — Progress Notes (Addendum)
 Post Partum Day 1 Subjective:  Alison Martin is a 28 y.o. H6E6996 [redacted]w[redacted]d s/p SVD complicated by post partum hemorrhage.  No acute events overnight. She has slept about 1.5 hours.   Pt denies problems with ambulating, voiding or po intake.  Pain is well controlled.  She has not had flatus or bowel movements yet.  Lochia Moderate. She describes her bleeding as slowly decreasing and estimates it to be a bit more bleeding than a regular period. She notes that it fills her pad. Plan for birth control is between either nexplanon  or IUD.  Method of Feeding: breast. Patient feels that breast feeding is going well and feeding baby [name] every 2 hours.   Objective: Blood pressure (!) 92/56, pulse 79, temperature 98.5 F (36.9 C), temperature source Oral, resp. rate 18, height 5' 9 (1.753 m), weight 101 kg, last menstrual period 02/15/2023, SpO2 100%, unknown if currently breastfeeding.  Physical Exam:  General: alert, cooperative and no distress Chest: normal WOB. CTAB Heart: Regular rate. No m/r/g Uterine Fundus: firm DVT Evaluation: No evidence of DVT seen on physical exam. Homan's sign negative. Extremities: No pretibial edema  Recent Labs    11/26/23 1333 11/27/23 0421  HGB 11.4* 8.7*  HCT 36.5 27.7*    Assessment/Plan:  ASSESSMENT: Alison Martin is a 28 y.o. H6E6996 [redacted]w[redacted]d s/p SVD complicated by post partum hemorrhage. Overall, she is doing well with controlled pain and slowly decreasing bleeding.   Consider starting IV iron Continue routine PP care Breastfeeding support PRN  LOS: 1 day   [place] Prey 11/27/2023, 6:25 AM

## 2023-11-27 NOTE — Lactation Note (Signed)
 This note was copied from a baby's chart. Lactation Consultation Note  Patient Name: Alison Martin Date: 11/27/2023 Age:28 hours Reason for consult: Follow-up assessment;Term;Nipple pain/trauma (DAT+ infant) Video interpreter Lucienne [phone]) used to communicate with [name] in Spanish.  P3- Infant is DAT+ and has a bili level of 8.8. Infant has been placed under bili lights for the night. LC asked [name] how the feedings have been going. [name] reports that infant is nursing very well, but he is starting to cry almost every hour now. She is worried that he may be getting enough milk. LC reviewed day 2 cluster feeding and what to expect. LC encouraged [name] to also use the manual pump that the [facility] team provided and feed back her EBM. LC offered to set up the DEBP, but [name] declined. [name] reports having tender nipples. LC provided her with coconut oil. [name] denies having any questions at this time. LC encouraged [name] to call for further assistance as needed.  Maternal Data Has patient been taught Hand Expression?: Yes Does the patient have breastfeeding experience prior to this delivery?: Yes  Feeding Mother's Current Feeding Choice: Breast Milk  Lactation Tools Discussed/Used Tools: Coconut oil Pump Education: Milk Storage  Interventions Interventions: Breast feeding basics reviewed;Coconut oil;Education;LC Services brochure  Discharge Discharge Education: Engorgement and breast care;Warning signs for feeding baby Pump: Manual  Consult Status Consult Status: Follow-up Date: 11/28/23 Follow-up type: In-patient    Recardo Hoit BS, IBCLC 11/27/2023, 7:54 PM

## 2023-11-28 ENCOUNTER — Other Ambulatory Visit (HOSPITAL_COMMUNITY): Payer: Self-pay

## 2023-11-28 MED ORDER — IBUPROFEN 600 MG PO TABS
600.0000 mg | ORAL_TABLET | Freq: Four times a day (QID) | ORAL | 0 refills | Status: AC
  Filled 2023-11-28: qty 30, 8d supply, fill #0

## 2023-11-28 MED ORDER — ACETAMINOPHEN 500 MG PO TABS
1000.0000 mg | ORAL_TABLET | Freq: Four times a day (QID) | ORAL | 0 refills | Status: AC | PRN
  Filled 2023-11-28: qty 30, 4d supply, fill #0

## 2023-11-28 MED ORDER — SENNOSIDES-DOCUSATE SODIUM 8.6-50 MG PO TABS
2.0000 | ORAL_TABLET | Freq: Every evening | ORAL | 0 refills | Status: AC | PRN
  Filled 2023-11-28: qty 30, 15d supply, fill #0

## 2023-11-28 NOTE — Lactation Note (Signed)
 This note was copied from a baby's chart. Lactation Consultation Note  Patient Name: Alison Martin Date: 11/28/2023 Age:28 hours Reason for consult: Follow-up assessment;Term  P3. Used House Spanish interpreter Susanna for consult. Mom stated bf going well. She is a little sore from not being able to get a deep latch when baby was on photo therapy and BF. Discussed how to obtain a deep latch. Mom is just BF. Praised mom. Mom stated she doesn't have any questions or concerns at this time. Encouraged to call if needs assistance or questions. Maternal Data    Feeding    LATCH Score          Comfort (Breast/Nipple): Filling, red/small blisters or bruises, mild/mod discomfort (soreness from baby being on photo therapy and feeding)         Lactation Tools Discussed/Used Tools: Coconut oil  Interventions Interventions: Breast feeding basics reviewed;Coconut oil;Education  Discharge    Consult Status Consult Status: Follow-up Date: 11/29/23 Follow-up type: In-patient    Sherryl Valido G 11/28/2023, 9:20 PM

## 2023-11-28 NOTE — Plan of Care (Signed)
  Problem: Coping: Goal: Level of anxiety will decrease Outcome: Completed/Met   Problem: Elimination: Goal: Will not experience complications related to bowel motility Outcome: Completed/Met   Problem: Pain Managment: Goal: General experience of comfort will improve and/or be controlled Outcome: Completed/Met   Problem: Safety: Goal: Ability to remain free from injury will improve Outcome: Completed/Met   Problem: Skin Integrity: Goal: Risk for impaired skin integrity will decrease Outcome: Completed/Met   Problem: Education: Goal: Knowledge of Childbirth will improve Outcome: Completed/Met Goal: Ability to make informed decisions regarding treatment and plan of care will improve Outcome: Completed/Met Goal: Ability to state and carry out methods to decrease the pain will improve Outcome: Completed/Met Goal: Individualized Educational Video(s) Outcome: Completed/Met   Problem: Coping: Goal: Ability to verbalize concerns and feelings about labor and delivery will improve Outcome: Completed/Met   Problem: Life Cycle: Goal: Ability to make normal progression through stages of labor will improve Outcome: Completed/Met Goal: Ability to effectively push during vaginal delivery will improve Outcome: Completed/Met   Problem: Role Relationship: Goal: Will demonstrate positive interactions with the child Outcome: Completed/Met   Problem: Safety: Goal: Risk of complications during labor and delivery will decrease Outcome: Completed/Met   Problem: Pain Management: Goal: Relief or control of pain from uterine contractions will improve Outcome: Completed/Met   Problem: Activity: Goal: Will verbalize the importance of balancing activity with adequate rest periods Outcome: Completed/Met   Problem: Coping: Goal: Ability to identify and utilize available resources and services will improve Outcome: Completed/Met   Problem: Life Cycle: Goal: Chance of risk for complications  during the postpartum period will decrease Outcome: Completed/Met   Problem: Skin Integrity: Goal: Demonstration of wound healing without infection will improve Outcome: Completed/Met

## 2023-11-29 ENCOUNTER — Inpatient Hospital Stay (HOSPITAL_COMMUNITY)

## 2023-11-29 ENCOUNTER — Inpatient Hospital Stay (HOSPITAL_COMMUNITY): Admission: RE | Admit: 2023-11-29 | Source: Home / Self Care | Admitting: Family Medicine

## 2023-11-29 NOTE — Lactation Note (Addendum)
 This note was copied from a baby's chart. Lactation Consultation Note  Patient Name: Alison Martin Unijb'd Date: 11/29/2023 Age:28 hours Reason for consult: Follow-up assessment;Term  Visited with family of 36 74/22 weeks old female [name]; Ms. Jakes is a P3 and experienced breastfeeding. Baby d/c phototherapy and is ready for discharge. Ms. Ohlsen has been consistently taking her to breast and reported feedings at the breast are comfortable; parents have also been putting baby on the sun (near window).   Parents are taking [name] home today. Reviewed discharge education and the importance of consistent nipple stimulation (baby's mouth preferably or/and pumping) for the prevention of engorgement and to protect her supply. She politely declined a referral to [facility] but has their contact information in case she needs to reach out. Encouraged +8 feedings/24 hours and to pump after if she feels baby is not fully emptying the breast. Let her know that this is not the time to store her milk but giving it to baby instead due to Hx of hyperbilirubinemia. FOB, female visitor and siblings present. All questions and concerns answered, family to contact [facility] services PRN.  Feeding Mother's Current Feeding Choice: Breast Milk  Lactation Tools Discussed/Used Tools: Pump;Flanges Flange Size: 18 Breast pump type: Manual Pump Education: Setup, frequency, and cleaning;Milk Storage Reason for Pumping: patient's request, no pumping at home Pumping frequency: PRN  Interventions Interventions: Breast feeding basics reviewed;Hand pump;Education  Discharge Discharge Education: Engorgement and breast care;Warning signs for feeding baby;Outpatient recommendation Pump: Manual  Consult Status Consult Status: Complete Date: 11/29/23 Follow-up type: Call as needed   Chade Pitner GORMAN Crate 11/29/2023, 11:34 AM

## 2023-12-10 ENCOUNTER — Telehealth (HOSPITAL_COMMUNITY): Payer: Self-pay | Admitting: *Deleted

## 2023-12-10 NOTE — Telephone Encounter (Signed)
 12/10/2023  Name: Alison Martin MRN: [id number] DOB: 1995-02-14  Reason for Call:  Transition of Care Hospital Discharge Call  Contact Status: Patient Contact Status: Message  Language assistant needed: Interpreter Mode: Telephonic Interpreter Interpreter Name: Jayson [id number]        Follow-Up Questions:    Van Postnatal Depression Scale:  In the Past 7 Days:    PHQ2-9 Depression Scale:     Discharge Follow-up:    Post-discharge interventions: NA  Mliss Sieve, RN 12/10/2023 13:54

## 2023-12-27 ENCOUNTER — Ambulatory Visit: Admitting: Family Medicine

## 2023-12-27 ENCOUNTER — Encounter: Payer: Self-pay | Admitting: Family Medicine

## 2023-12-27 DIAGNOSIS — Z603 Acculturation difficulty: Secondary | ICD-10-CM

## 2023-12-27 DIAGNOSIS — Z3202 Encounter for pregnancy test, result negative: Secondary | ICD-10-CM | POA: Diagnosis not present

## 2023-12-27 DIAGNOSIS — Z30017 Encounter for initial prescription of implantable subdermal contraceptive: Secondary | ICD-10-CM | POA: Diagnosis not present

## 2023-12-27 DIAGNOSIS — Z758 Other problems related to medical facilities and other health care: Secondary | ICD-10-CM | POA: Diagnosis not present

## 2023-12-27 LAB — POCT URINE PREGNANCY: Preg Test, Ur: NEGATIVE

## 2023-12-27 MED ORDER — ETONOGESTREL 68 MG ~~LOC~~ IMPL
68.0000 mg | DRUG_IMPLANT | Freq: Once | SUBCUTANEOUS | Status: AC
  Administered 2023-12-27: 68 mg via SUBCUTANEOUS

## 2023-12-27 NOTE — Patient Instructions (Signed)
 Nexplanon  aftercare: Leave the top bandage on for 24 hours. Keep the smaller bandage clean, dry, and in place for 3 to 5 days.  Call our office immediately if you have:  -Pain in your lower leg that does not go away -Severe chest pain or heaviness in your chest -Sudden shortness of breath -Sharp chest pain coughing blood -Symptoms of severe allergic reaction such as a swollen face, tongue, or throat, or have trouble breathing or swallowing -Sudden severe headache, unlike her usual headaches -Weakness or numbness in your arms or legs or trouble speaking -Sudden blindness -Either partial or complete yellowing of your skin or whites of your eyes, especially with fever, tiredness, loss of appetite, dark-colored urine, or light-colored bowel movements -Severe pain, swelling or tenderness in the lower stomach -Lump in your breast -Problems sleeping, or lack of energy, tiredness, or if you feel very sad -Heavy menstrual bleeding, heavier than usual -Suspect the implant is broken or bent while in your arm

## 2023-12-27 NOTE — Progress Notes (Signed)
 Post Partum Visit Note  Alison Martin is a 28 y.o. G10P3003 female who presents for a postpartum visit. She is 4 weeks postpartum following a normal spontaneous vaginal delivery.  I have fully reviewed the prenatal and intrapartum course. The delivery was at [redacted]w[redacted]d gestational weeks.  Anesthesia: none. Postpartum course has been good. Baby is doing well. Baby is feeding by breast. Bleeding staining only. Bowel function is normal. Bladder function is normal. Patient is not sexually active. Contraception method is Nexplanon . Postpartum depression screening: negative.   The pregnancy intention screening data noted above was reviewed. Potential methods of contraception were discussed. The patient elected to proceed with No data recorded.   Edinburgh Postnatal Depression Scale - 12/27/23 0920       Edinburgh Postnatal Depression Scale:  In the Past 7 Days   I have been able to laugh and see the funny side of things. 0    I have looked forward with enjoyment to things. 0    I have blamed myself unnecessarily when things went wrong. 0    I have been anxious or worried for no good reason. 2    I have felt scared or panicky for no good reason. 0    Things have been getting on top of me. 0    I have been so unhappy that I have had difficulty sleeping. 0    I have felt sad or miserable. 0    I have been so unhappy that I have been crying. 1    The thought of harming myself has occurred to me. 0    Edinburgh Postnatal Depression Scale Total 3          Health Maintenance Due  Topic Date Due   Hepatitis B Vaccines 19-59 Average Risk (1 of 3 - 19+ 3-dose series) Never done   HPV VACCINES (1 - 3-dose SCDM series) Never done   Influenza Vaccine  08/16/2023   COVID-19 Vaccine (1 - 2025-26 season) Never done    The following portions of the patient's history were reviewed and updated as appropriate: allergies, current medications, past family history, past medical history, past social history,  past surgical history, and problem list.  Review of Systems Pertinent items are noted in HPI.  Objective:  BP 116/62   Pulse 65   Ht 5' 9 (1.753 m)   Wt 201 lb 9.6 oz (91.4 kg)   LMP 02/15/2023 (Exact Date)   Breastfeeding Yes   BMI 29.77 kg/m    General:  alert, cooperative, and no distress   Breasts:  declined  Lungs: clear to auscultation bilaterally  Heart:  regular rate and rhythm, S1, S2 normal, no murmur, click, rub or gallop  Abdomen: soft, non-tender; bowel sounds normal; no masses,  no organomegaly   GU exam:  not indicated   NEXPLANON  INSERTION PRE-OP DIAGNOSIS: desired long-term, reversible contraception  POST-OP DIAGNOSIS: Same  PROCEDURE: Nexplanon   placement Performing Provider: Joesph Sear, PA-C  Risks and benefits reviewed with the patient. Informed consent obtained, patient opts to proceed today.    PROCEDURE:  Site (check): left arm  Lot # [id number] Sterile Preparation: Chlorhexidine   Insertion site was selected 8 - 10 cm from medial epicondyle and marked along with guiding site using indentation from pen  Procedure area was prepped and draped in a sterile fashion. 3 mL of 1% lidocaine  without epinephrine used for subcutaneous anesthesia. Anesthesia confirmed.  Nexplanon   trocar was inserted subcutaneously and then Nexplanon   capsule delivered  subcutaneously Trocar was removed from the insertion site. Nexplanon   capsule was palpated by provider and patient to assure satisfactory placement. Estimated blood loss <1 mL Dressings applied: Steri-Strip and small pressure bandage Followup: The patient tolerated the procedure well without complications.  Standard post-procedure care is explained and return precautions are given.  Assessment:   1. Postpartum care and examination (Primary)  2. Insertion of implantable subdermal contraceptive  3. Language barrier Due to language barrier, an in-person interpreter was present during the history-taking,  physical exam, and subsequent discussion with this patient.     Normal postpartum exam.   Plan:   Essential components of care per ACOG recommendations:  1.  Mood and well being: Patient with positive depression screening today. Reviewed local resources for support. Patient reports good support at home, but adjusting to new levels of stress with a newborn. - Patient tobacco use? No.   - hx of drug use? No.    2. Infant care and feeding:  -Patient currently breastmilk feeding? Yes. Reviewed importance of draining breast regularly to support lactation.  -Social determinants of health (SDOH) reviewed in EPIC. No concerns  3. Sexuality, contraception and birth spacing - Patient does not want a pregnancy in the next year.  Desired family size is 5-6 children total, but does not desire another pregnancy for at least 5 years.  - Reviewed reproductive life planning. Reviewed contraceptive methods based on pt preferences and effectiveness.  Patient desired Hormonal Implant today. See procedure note above. - Discussed birth spacing of 18 months  4. Sleep and fatigue -Encouraged family/partner/community support of 4 hrs of uninterrupted sleep to help with mood and fatigue  5. Physical Recovery  - Discussed patients delivery and complications. She describes her labor as good. - Patient had a Vaginal problems after delivery including significant bleeding, given TXA. Patient had no laceration. Perineal healing reviewed. Patient expressed understanding - Patient has urinary incontinence? No. - Patient is safe to resume physical and sexual activity. Recommend backup contraception for 1 week.  6.  Health Maintenance - HM due items addressed No - up to date - Last pap smear  Diagnosis  Date Value Ref Range Status  06/14/2023   Final   - Negative for Intraepithelial Lesions or Malignancy (NILM)  06/14/2023 - Benign reactive/reparative changes  Final   Pap smear not done at today's visit. Next due  06/14/2026 -Breast Cancer screening indicated? No.   7. Chronic Disease/Pregnancy Condition follow up: None  - PCP follow up  Joesph DELENA Sear, PA [facility] [employer], [facility]

## 2023-12-27 NOTE — Addendum Note (Signed)
 Addended by: LANG RIGGS A on: 12/27/2023 10:16 AM   Modules accepted: Orders
